# Patient Record
Sex: Male | Born: 1985 | Hispanic: No | Marital: Single | Smoking: Current every day smoker
Health system: Southern US, Community
[De-identification: ages and names within clinical notes are randomized; demographics above are authoritative.]

## PROBLEM LIST (undated history)

## (undated) DIAGNOSIS — N200 Calculus of kidney: Secondary | ICD-10-CM

## (undated) HISTORY — PX: TONGUE SURGERY: SHX810

---

## 2013-09-30 ENCOUNTER — Emergency Department (HOSPITAL_COMMUNITY)
Admission: EM | Admit: 2013-09-30 | Discharge: 2013-09-30 | Disposition: A | Discharge status: Home / Self Care | Payer: Self-pay | Attending: Emergency Medicine | Admitting: Emergency Medicine

## 2013-09-30 ENCOUNTER — Emergency Department (HOSPITAL_COMMUNITY): Payer: Self-pay

## 2013-09-30 ENCOUNTER — Encounter (HOSPITAL_COMMUNITY): Payer: Self-pay | Admitting: Emergency Medicine

## 2013-09-30 DIAGNOSIS — Z87442 Personal history of urinary calculi: Secondary | ICD-10-CM | POA: Insufficient documentation

## 2013-09-30 DIAGNOSIS — F17200 Nicotine dependence, unspecified, uncomplicated: Secondary | ICD-10-CM | POA: Insufficient documentation

## 2013-09-30 DIAGNOSIS — R109 Unspecified abdominal pain: Secondary | ICD-10-CM | POA: Insufficient documentation

## 2013-09-30 HISTORY — DX: Calculus of kidney: N20.0

## 2013-09-30 LAB — URINALYSIS, ROUTINE W REFLEX MICROSCOPIC
Bilirubin Urine: NEGATIVE
GLUCOSE, UA: NEGATIVE mg/dL
Hgb urine dipstick: NEGATIVE
Ketones, ur: NEGATIVE mg/dL
LEUKOCYTES UA: NEGATIVE
NITRITE: NEGATIVE
PROTEIN: NEGATIVE mg/dL
Specific Gravity, Urine: 1.015 (ref 1.005–1.030)
UROBILINOGEN UA: 0.2 mg/dL (ref 0.0–1.0)
pH: 7.5 (ref 5.0–8.0)

## 2013-09-30 MED ORDER — HYDROCODONE-ACETAMINOPHEN 7.5-325 MG PO TABS: 1.0000 | ORAL_TABLET | ORAL | Status: DC | PRN

## 2013-09-30 MED ORDER — ONDANSETRON HCL 4 MG/2ML IJ SOLN
4.0000 mg | Freq: Once | INTRAMUSCULAR | Status: DC
  Filled 2013-09-30: qty 2

## 2013-09-30 MED ORDER — ONDANSETRON HCL 4 MG/2ML IJ SOLN
4.0000 mg | Freq: Once | INTRAMUSCULAR | Status: AC
  Administered 2013-09-30: 4 mg via INTRAVENOUS

## 2013-09-30 MED ORDER — TAMSULOSIN HCL 0.4 MG PO CAPS: 0.4000 mg | ORAL_CAPSULE | Freq: Every day | ORAL | Status: DC

## 2013-09-30 MED ORDER — DICLOFENAC SODIUM 75 MG PO TBEC: 75.0000 mg | DELAYED_RELEASE_TABLET | Freq: Two times a day (BID) | ORAL | Status: DC

## 2013-09-30 MED ORDER — MORPHINE SULFATE 4 MG/ML IJ SOLN
8.0000 mg | Freq: Once | INTRAMUSCULAR | Status: AC
  Administered 2013-09-30: 8 mg via INTRAVENOUS
  Filled 2013-09-30: qty 2

## 2013-09-30 MED ORDER — TAMSULOSIN HCL 0.4 MG PO CAPS
0.4000 mg | ORAL_CAPSULE | Freq: Once | ORAL | Status: AC
  Administered 2013-09-30: 0.4 mg via ORAL
  Filled 2013-09-30: qty 1

## 2013-09-30 MED ORDER — KETOROLAC TROMETHAMINE 30 MG/ML IJ SOLN
30.0000 mg | Freq: Once | INTRAMUSCULAR | Status: AC
  Administered 2013-09-30: 30 mg via INTRAVENOUS
  Filled 2013-09-30: qty 1

## 2013-09-30 NOTE — Discharge Instructions (Signed)
The x-ray of your chest and abdomen is negative for any acute changes. There is a nonobstructing stone in the left kidney, nothing noted on the right. Your urine is negative for infection or evidence of blood. Please use the Flomax and diclofenac daily. May use Norco for pain if needed. Please see your primary physician as sone as possible. Please strain all urine over the next 3 or 4 days. Flank Pain Flank pain refers to pain that is located on the side of the body between the upper abdomen and the back. The pain may occur over a short period of time (acute) or may be long-term or reoccurring (chronic). It may be mild or severe. Flank pain can be caused by many things. CAUSES  Some of the more common causes of flank pain include:  Muscle strains.   Muscle spasms.   A disease of your spine (vertebral disk disease).   A lung infection (pneumonia).   Fluid around your lungs (pulmonary edema).   A kidney infection.   Kidney stones.   A very painful skin rash caused by the chickenpox virus (shingles).   Gallbladder disease.  HOME CARE INSTRUCTIONS  Home care will depend on the cause of your pain. In general,  Rest as directed by your caregiver.  Drink enough fluids to keep your urine clear or pale yellow.  Only take over-the-counter or prescription medicines as directed by your caregiver. Some medicines may help relieve the pain.  Tell your caregiver about any changes in your pain.  Follow up with your caregiver as directed. SEEK IMMEDIATE MEDICAL CARE IF:   Your pain is not controlled with medicine.   You have new or worsening symptoms.  Your pain increases.   You have abdominal pain.   You have shortness of breath.   You have persistent nausea or vomiting.   You have swelling in your abdomen.   You feel faint or pass out.   You have blood in your urine.  You have a fever or persistent symptoms for more than 2 3 days.  You have a fever and your  symptoms suddenly get worse. MAKE SURE YOU:   Understand these instructions.  Will watch your condition.  Will get help right away if you are not doing well or get worse. Document Released: 05/31/2005 Document Revised: 01/02/2012 Document Reviewed: 11/22/2011 [facility] Patient Information 2014 [place], [place].

## 2013-09-30 NOTE — ED Notes (Signed)
Patient was advised that he had to wait in Emergency department waiting area for 4 hours before walking home due to having pain medication. Patient responded with understanding

## 2013-09-30 NOTE — ED Notes (Signed)
Pt with right flank pain that started this morning, hx of kidney stones, pt denies blood in urine, +N/V

## 2013-09-30 NOTE — ED Provider Notes (Signed)
CSN: [id number]     Arrival date & time 09/30/13  1308 History   First MD Initiated Contact with Patient 09/30/13 1448     Chief Complaint  Patient presents with  . Flank Pain     (Consider location/radiation/quality/duration/timing/severity/associated sxs/prior Treatment) HPI Comments: Patient is a 28 year old male who presents to the emergency department with complaint of right flank area pain. The patient states that he noted pain this morning when he awakened it felt very much like a kidney stone pain that he had in the past. He denies passing any blood. He denies any fever or chills on yesterday or this morning. The patient states that he was able to pass his stone in the emergency department at the [facility] nearly a year ago after receiving Flomax. The patient has not had any injury to this area. He has not had any change in his appetite. He has not had nausea or vomiting. His been no testicular pain, an  no severe back pain.  Patient is a 28 y.o. male presenting with flank pain. The history is provided by the patient.  Flank Pain Pertinent negatives include no abdominal pain, arthralgias, chest pain, coughing or neck pain.    Past Medical History  Diagnosis Date  . Kidney stones    Past Surgical History  Procedure Laterality Date  . Tongue surgery     History reviewed. No pertinent family history. History  Substance Use Topics  . Smoking status: Current Every Day Smoker    Types: Cigarettes  . Smokeless tobacco: Not on file  . Alcohol Use: No    Review of Systems  Constitutional: Negative for activity change.       All ROS Neg except as noted in HPI  HENT: Negative for nosebleeds.   Eyes: Negative for photophobia and discharge.  Respiratory: Negative for cough, shortness of breath and wheezing.   Cardiovascular: Negative for chest pain and palpitations.  Gastrointestinal: Negative for abdominal pain and blood in stool.  Genitourinary: Positive for flank pain.  Negative for dysuria, frequency and hematuria.  Musculoskeletal: Negative for arthralgias, back pain and neck pain.  Skin: Negative.   Neurological: Negative for dizziness, seizures and speech difficulty.  Psychiatric/Behavioral: Negative for hallucinations and confusion.      Allergies  Review of patient's allergies indicates no known allergies.  Home Medications   Prior to Admission medications   Not on File   BP 136/75  Pulse 96  Temp(Src) 97.7 F (36.5 C) (Oral)  SpO2 96% Physical Exam  Nursing note and vitals reviewed. Constitutional: He is oriented to person, place, and time. He appears well-developed and well-nourished.  Non-toxic appearance.  HENT:  Head: Normocephalic.  Right Ear: Tympanic membrane and external ear normal.  Left Ear: Tympanic membrane and external ear normal.  Eyes: EOM and lids are normal. Pupils are equal, round, and reactive to light.  Neck: Normal range of motion. Neck supple. Carotid bruit is not present.  Cardiovascular: Normal rate, regular rhythm, normal heart sounds, intact distal pulses and normal pulses.   Pulmonary/Chest: Breath sounds normal. No respiratory distress.  Abdominal: Soft. Bowel sounds are normal. There is no hepatosplenomegaly. There is no tenderness. There is no rigidity, no guarding and no tenderness at McBurney's point.  Right flank area tenderness. No right lower abdomen pain. No significant pain at [facility] point. No pain with SLR.  No pain with heel drop.  Musculoskeletal: Normal range of motion.  Lymphadenopathy:       Head (right  side): No submandibular adenopathy present.       Head (left side): No submandibular adenopathy present.    He has no cervical adenopathy.  Neurological: He is alert and oriented to person, place, and time. He has normal strength. No cranial nerve deficit or sensory deficit.  Skin: Skin is warm and dry.  Psychiatric: He has a normal mood and affect. His speech is normal.    ED Course   Procedures (including critical care time) Labs Review Labs Reviewed  URINALYSIS, ROUTINE W REFLEX MICROSCOPIC    Imaging Review Dg Abd Acute W/chest  09/30/2013   CLINICAL DATA:  Flank pain and nausea  EXAM: ACUTE ABDOMEN SERIES (ABDOMEN 2 VIEW & CHEST 1 VIEW)  COMPARISON:  None.  FINDINGS: Cardiac shadow is within normal limits. The lungs are clear bilaterally.  Calcification is noted of the left mid kidney consistent with a nonobstructing stone. No definitive ureteral calculi are seen. No other focal abnormality is noted. No bony abnormality is seen.  IMPRESSION: Calcification of the left kidney consistent with a nonobstructing renal stone. No other focal abnormality is noted.   Electronically Signed   By: Alcide CleverMark  Lukens M.D.   On: 09/30/2013 15:42     EKG Interpretation None      MDM The acute abdomen film shows a calcification of the left kidney consistent with a nonobstructing renal stone, no evidence of calcifications on the right. The chest portion is within normal limits. The vital signs are well within normal limits. The pulse oximetry is 96% on room air. Within normal limits by my interpretation.  The patient received intravenous Toradol and morphine with significant improvement in his pain and discomfort.  Patient is advised to strain all urine. He is given a prescription for Flomax, and Toradol, and Norco.    Final diagnoses:  None    **I have reviewed nursing notes, vital signs, and all appropriate lab and imaging results for this patient.Kathie Dike*    Edwyna Dangerfield M Analucia Hush, PA-C 09/30/13 1624

## 2013-09-30 NOTE — ED Provider Notes (Signed)
Medical screening examination/treatment/procedure(s) were performed by non-physician practitioner and as supervising physician I was immediately available for consultation/collaboration.   EKG Interpretation None        Zeffie Bickert W Grabiel Schmutz, MD 09/30/13 2158 

## 2013-12-02 ENCOUNTER — Encounter (HOSPITAL_COMMUNITY): Payer: Self-pay | Admitting: Emergency Medicine

## 2013-12-02 ENCOUNTER — Emergency Department (HOSPITAL_COMMUNITY)
Admission: EM | Admit: 2013-12-02 | Discharge: 2013-12-02 | Disposition: A | Discharge status: Home / Self Care | Payer: Self-pay | Attending: Emergency Medicine | Admitting: Emergency Medicine

## 2013-12-02 DIAGNOSIS — Z792 Long term (current) use of antibiotics: Secondary | ICD-10-CM | POA: Insufficient documentation

## 2013-12-02 DIAGNOSIS — F17200 Nicotine dependence, unspecified, uncomplicated: Secondary | ICD-10-CM | POA: Insufficient documentation

## 2013-12-02 DIAGNOSIS — Z791 Long term (current) use of non-steroidal anti-inflammatories (NSAID): Secondary | ICD-10-CM | POA: Insufficient documentation

## 2013-12-02 DIAGNOSIS — K029 Dental caries, unspecified: Secondary | ICD-10-CM | POA: Insufficient documentation

## 2013-12-02 DIAGNOSIS — K0889 Other specified disorders of teeth and supporting structures: Secondary | ICD-10-CM

## 2013-12-02 DIAGNOSIS — K089 Disorder of teeth and supporting structures, unspecified: Secondary | ICD-10-CM | POA: Insufficient documentation

## 2013-12-02 DIAGNOSIS — Z87442 Personal history of urinary calculi: Secondary | ICD-10-CM | POA: Insufficient documentation

## 2013-12-02 MED ORDER — CLINDAMYCIN HCL 150 MG PO CAPS: 150.0000 mg | ORAL_CAPSULE | Freq: Four times a day (QID) | ORAL | Status: DC

## 2013-12-02 MED ORDER — CLINDAMYCIN HCL 150 MG PO CAPS
300.0000 mg | ORAL_CAPSULE | Freq: Once | ORAL | Status: AC
  Administered 2013-12-02: 300 mg via ORAL
  Filled 2013-12-02: qty 2

## 2013-12-02 MED ORDER — NAPROXEN 500 MG PO TABS: 500.0000 mg | ORAL_TABLET | Freq: Two times a day (BID) | ORAL | Status: DC

## 2013-12-02 NOTE — ED Notes (Signed)
Caleb Miller at bedside. 

## 2013-12-02 NOTE — ED Notes (Signed)
Upper right dental pain. Patient states that he is signed up for a dental clinic but it will be 8 months to get in to see them.

## 2013-12-02 NOTE — Discharge Instructions (Signed)

## 2013-12-03 ENCOUNTER — Emergency Department (HOSPITAL_COMMUNITY)
Admission: EM | Admit: 2013-12-03 | Discharge: 2013-12-03 | Disposition: A | Discharge status: Home / Self Care | Payer: Self-pay | Attending: Emergency Medicine | Admitting: Emergency Medicine

## 2013-12-03 ENCOUNTER — Encounter (HOSPITAL_COMMUNITY): Payer: Self-pay | Admitting: Emergency Medicine

## 2013-12-03 DIAGNOSIS — Z87442 Personal history of urinary calculi: Secondary | ICD-10-CM | POA: Insufficient documentation

## 2013-12-03 DIAGNOSIS — Z791 Long term (current) use of non-steroidal anti-inflammatories (NSAID): Secondary | ICD-10-CM | POA: Insufficient documentation

## 2013-12-03 DIAGNOSIS — Z792 Long term (current) use of antibiotics: Secondary | ICD-10-CM | POA: Insufficient documentation

## 2013-12-03 DIAGNOSIS — K089 Disorder of teeth and supporting structures, unspecified: Secondary | ICD-10-CM | POA: Insufficient documentation

## 2013-12-03 DIAGNOSIS — Z9889 Other specified postprocedural states: Secondary | ICD-10-CM | POA: Insufficient documentation

## 2013-12-03 DIAGNOSIS — F17200 Nicotine dependence, unspecified, uncomplicated: Secondary | ICD-10-CM | POA: Insufficient documentation

## 2013-12-03 DIAGNOSIS — K029 Dental caries, unspecified: Secondary | ICD-10-CM | POA: Insufficient documentation

## 2013-12-03 DIAGNOSIS — K0889 Other specified disorders of teeth and supporting structures: Secondary | ICD-10-CM

## 2013-12-03 MED ORDER — LIDOCAINE VISCOUS 2 % MT SOLN
OROMUCOSAL | Status: AC
  Administered 2013-12-03: 15 mL via OROMUCOSAL
  Filled 2013-12-03: qty 15

## 2013-12-03 MED ORDER — LIDOCAINE VISCOUS 2 % MT SOLN
15.0000 mL | Freq: Once | OROMUCOSAL | Status: AC
  Administered 2013-12-03: 15 mL via OROMUCOSAL

## 2013-12-03 NOTE — Discharge Instructions (Signed)
Dental Pain  Toothache is pain in or around a tooth. It may get worse with chewing or with cold or heat.   HOME CARE  · Your dentist may use a numbing medicine during treatment. If so, you may need to avoid eating until the medicine wears off. Ask your dentist about this.  · Only take medicine as told by your dentist or doctor.  · Avoid chewing food near the painful tooth until after all treatment is done. Ask your dentist about this.  GET HELP RIGHT AWAY IF:   · The problem gets worse or new problems appear.  · You have a fever.  · There is redness and puffiness (swelling) of the face, jaw, or neck.  · You cannot open your mouth.  · There is pain in the jaw.  · There is very bad pain that is not helped by medicine.  MAKE SURE YOU:   · Understand these instructions.  · Will watch your condition.  · Will get help right away if you are not doing well or get worse.  Document Released: 09/26/2007 Document Revised: 07/02/2011 Document Reviewed: 09/26/2007  [employer]® Patient Information ©2015 [employer]. This information is not intended to replace advice given to you by your health care provider. Make sure you discuss any questions you have with your health care provider.

## 2013-12-03 NOTE — ED Provider Notes (Signed)
CSN: [id number]     Arrival date & time 12/03/13  2013 History   First MD Initiated Contact with Patient 12/03/13 2059     Chief Complaint  Patient presents with  . Dental Pain   Caleb Miller is a 28 y.o. male who presents to the Emergency Department complaining of dental pain.  Patient was seen here last evening for same and returns due to continued pain and states the naprosyn he was prescribed is not helping his pain.   (Consider location/radiation/quality/duration/timing/severity/associated sxs/prior Treatment) Patient is a 28 y.o. male presenting with tooth pain. The history is provided by the patient.  Dental Pain Location:  Upper Upper teeth location:  7/RU lateral incisor Quality:  Throbbing Severity:  Severe Onset quality:  Gradual Timing:  Constant Progression:  Worsening Chronicity:  New Context: dental caries and poor dentition   Context: not abscess   Relieved by:  Nothing Worsened by:  Hot food/drink and cold food/drink Ineffective treatments:  NSAIDs Associated symptoms: no congestion, no difficulty swallowing, no drooling, no facial pain, no facial swelling, no fever, no gum swelling, no headaches, no neck pain, no neck swelling, no oral bleeding and no trismus   Risk factors: lack of dental care, periodontal disease and smoking   Risk factors: no diabetes     Past Medical History  Diagnosis Date  . Kidney stones    Past Surgical History  Procedure Laterality Date  . Tongue surgery     No family history on file. History  Substance Use Topics  . Smoking status: Current Every Day Smoker    Types: Cigarettes  . Smokeless tobacco: Not on file  . Alcohol Use: No    Review of Systems  Constitutional: Negative for fever and appetite change.  HENT: Positive for dental problem. Negative for congestion, drooling, facial swelling, sore throat and trouble swallowing.   Eyes: Negative for pain and visual disturbance.  Musculoskeletal: Negative for neck pain  and neck stiffness.  Neurological: Negative for dizziness, facial asymmetry and headaches.  Hematological: Negative for adenopathy.  All other systems reviewed and are negative.     Allergies  Review of patient's allergies indicates no known allergies.  Home Medications   Prior to Admission medications   Medication Sig Start Date End Date Taking? Authorizing Provider  clindamycin (CLEOCIN) 150 MG capsule Take 1 capsule (150 mg total) by mouth 4 (four) times daily. For 7 days 12/02/13  Yes Athenia Rys L. Pretty Weltman, PA-C  naproxen (NAPROSYN) 500 MG tablet Take 1 tablet (500 mg total) by mouth 2 (two) times daily. 12/02/13  Yes Verdell Dykman L. Shantele Reller, PA-C   BP 124/65  Pulse 84  Temp(Src) 97.7 F (36.5 C) (Oral)  Resp 16  Ht 5\' 10"  (1.778 m)  Wt 180 lb (81.647 kg)  BMI 25.83 kg/m2  SpO2 100% Physical Exam  Nursing note and vitals reviewed. Constitutional: He is oriented to person, place, and time. He appears well-developed and well-nourished. No distress.  HENT:  Head: Normocephalic and atraumatic.  Right Ear: Tympanic membrane and ear canal normal.  Left Ear: Tympanic membrane and ear canal normal.  Mouth/Throat: Uvula is midline, oropharynx is clear and moist and mucous membranes are normal. No trismus in the jaw. Dental caries present. No dental abscesses or uvula swelling.  Pt has widespread dental decay and ttp of the right upper lateral incisor.  Mild erythema of the surrounding gums.    No facial swelling, obvious dental abscess, trismus, or sublingual abnml.    Neck: Normal range  of motion. Neck supple.  Cardiovascular: Normal rate, regular rhythm and normal heart sounds.   No murmur heard. Pulmonary/Chest: Effort normal and breath sounds normal. No respiratory distress.  Musculoskeletal: Normal range of motion.  Lymphadenopathy:    He has no cervical adenopathy.  Neurological: He is alert and oriented to person, place, and time. He exhibits normal muscle tone. Coordination normal.   Skin: Skin is warm and dry.    ED Course  Procedures (including critical care time) Labs Review Labs Reviewed - No data to display  Imaging Review No results found.   EKG Interpretation None      MDM   Final diagnoses:  Pain, dental    Pt is well appearing.  Airway patent.  No concerning symptoms for infection to the floor of the mouth or deep structures of the neck.  Patient also seen by me on previous visit.  Given info for an upcoming dental fair in [place].  Dispensed 15 ml cup viscous lidocaine for topical application to the tooth and given strict use precautions not to swallow.  He agrees to continue the clinda and naprosyn as prescribed.      Giovonni Poirier L. Trisha Mangle, PA-C 12/03/13 2135

## 2013-12-03 NOTE — ED Notes (Signed)
Discharge instructions reviewed with pt, questions answered. Pt verbalized understanding.  

## 2013-12-03 NOTE — ED Provider Notes (Signed)
Medical screening examination/treatment/procedure(s) were performed by non-physician practitioner and as supervising physician I was immediately available for consultation/collaboration.   EKG Interpretation None        Attikus Bartoszek L Vaibhav Fogleman, MD 12/03/13 2218 

## 2013-12-03 NOTE — ED Notes (Signed)
Patient states was seen yesterday for dental pain; states has been taking medication, but still having pain.

## 2013-12-04 NOTE — ED Provider Notes (Signed)
CSN: [id number]     Arrival date & time 12/02/13  1921 History   First MD Initiated Contact with Patient 12/02/13 1935     Chief Complaint  Patient presents with  . Dental Pain     (Consider location/radiation/quality/duration/timing/severity/associated sxs/prior Treatment)  Judithann Gravesnthony Frey is a 28 y.o. male who presents to the Emergency Department complaining of dental pain.  Patient states he has tried to make an appt with a dental clinic in [place], where he lives, but cannot get an appt for another 8 months and comes here with significant other who is also here for evaluation.   Patient is a 28 y.o. male presenting with tooth pain. The history is provided by the patient.  Dental Pain Location:  Upper Upper teeth location:  7/RU lateral incisor Quality:  Localized, throbbing and sharp Onset quality:  Gradual Duration: several days.   Timing:  Constant Progression:  Worsening Chronicity:  New Context: dental caries and poor dentition   Context: not trauma   Relieved by:  Nothing Worsened by:  Cold food/drink Ineffective treatments:  NSAIDs Associated symptoms: no congestion, no drooling, no facial pain, no facial swelling, no fever, no gum swelling, no headaches, no neck pain, no neck swelling, no oral bleeding, no oral lesions and no trismus   Risk factors: lack of dental care, periodontal disease and smoking   Risk factors: no diabetes     Past Medical History  Diagnosis Date  . Kidney stones    Past Surgical History  Procedure Laterality Date  . Tongue surgery     History reviewed. No pertinent family history. History  Substance Use Topics  . Smoking status: Current Every Day Smoker    Types: Cigarettes  . Smokeless tobacco: Not on file  . Alcohol Use: No    Review of Systems  Constitutional: Negative for fever and appetite change.  HENT: Positive for dental problem. Negative for congestion, drooling, facial swelling, mouth sores, sore throat and trouble  swallowing.   Eyes: Negative for pain and visual disturbance.  Musculoskeletal: Negative for neck pain and neck stiffness.  Neurological: Negative for dizziness, facial asymmetry and headaches.  Hematological: Negative for adenopathy.  All other systems reviewed and are negative.     Allergies  Review of patient's allergies indicates no known allergies.  Home Medications   Prior to Admission medications   Medication Sig Start Date End Date Taking? Authorizing Provider  clindamycin (CLEOCIN) 150 MG capsule Take 1 capsule (150 mg total) by mouth 4 (four) times daily. For 7 days 12/02/13   Eulla Kochanowski L. Yahira Timberman, PA-C  naproxen (NAPROSYN) 500 MG tablet Take 1 tablet (500 mg total) by mouth 2 (two) times daily. 12/02/13   Aeriana Speece L. Jibril Mcminn, PA-C   BP 121/79  Pulse 69  Temp(Src) 98.6 F (37 C) (Oral)  Resp 16  Ht 5\' 10"  (1.778 m)  Wt 170 lb (77.111 kg)  BMI 24.39 kg/m2  SpO2 100% Physical Exam  Nursing note and vitals reviewed. Constitutional: He is oriented to person, place, and time. He appears well-developed and well-nourished. No distress.  HENT:  Head: Normocephalic and atraumatic.  Right Ear: Tympanic membrane and ear canal normal.  Left Ear: Tympanic membrane and ear canal normal.  Mouth/Throat: Uvula is midline, oropharynx is clear and moist and mucous membranes are normal. No trismus in the jaw. Dental caries present. No dental abscesses or uvula swelling.  Pt has widespread dental decay and ttp of the right upper lateral incisor.  Mild erythema of the  surrounding gums.    No facial swelling, obvious dental abscess, trismus, or sublingual abnml.      Eyes: Conjunctivae and EOM are normal. Pupils are equal, round, and reactive to light.  Neck: Normal range of motion. Neck supple.  Cardiovascular: Normal rate, regular rhythm and normal heart sounds.   No murmur heard. Pulmonary/Chest: Effort normal and breath sounds normal.  Musculoskeletal: Normal range of motion.   Lymphadenopathy:    He has no cervical adenopathy.  Neurological: He is alert and oriented to person, place, and time. He exhibits normal muscle tone. Coordination normal.  Skin: Skin is warm and dry.    ED Course  Procedures (including critical care time) Labs Review Labs Reviewed - No data to display  Imaging Review No results found.   EKG Interpretation None      MDM   Final diagnoses:  Pain, dental    Patient reviewed on the [place] narcotic database.  Most recent prescription filed was June.    Pt is well appearing.  VSS.  No concerning sx's for infection to the floor of the mouth or deep structures of the neck.  He agrees to close f/u with a dentist, referral info given for local dentists.  Rx written for naprosyn and clindamycin.       Naheim Burgen L. Tayron Hunnell, PA-C 12/04/13 0101

## 2013-12-06 NOTE — ED Provider Notes (Signed)
Medical screening examination/treatment/procedure(s) were performed by non-physician practitioner and as supervising physician I was immediately available for consultation/collaboration.   EKG Interpretation None      Tyrek Lawhorn, MD, FACEP   Abdi Husak L Annelise Mccoy, MD 12/06/13 0705 

## 2014-02-19 ENCOUNTER — Emergency Department (HOSPITAL_COMMUNITY): Payer: Self-pay

## 2014-02-19 ENCOUNTER — Emergency Department (HOSPITAL_COMMUNITY)
Admission: EM | Admit: 2014-02-19 | Discharge: 2014-02-19 | Disposition: A | Discharge status: Home / Self Care | Payer: Self-pay | Attending: Emergency Medicine | Admitting: Emergency Medicine

## 2014-02-19 ENCOUNTER — Encounter (HOSPITAL_COMMUNITY): Payer: Self-pay | Admitting: Emergency Medicine

## 2014-02-19 DIAGNOSIS — Z720 Tobacco use: Secondary | ICD-10-CM | POA: Insufficient documentation

## 2014-02-19 DIAGNOSIS — N200 Calculus of kidney: Secondary | ICD-10-CM | POA: Insufficient documentation

## 2014-02-19 DIAGNOSIS — R109 Unspecified abdominal pain: Secondary | ICD-10-CM

## 2014-02-19 LAB — CBC WITH DIFFERENTIAL/PLATELET
BASOS PCT: 1 % (ref 0–1)
Basophils Absolute: 0 10*3/uL (ref 0.0–0.1)
Eosinophils Absolute: 0.3 10*3/uL (ref 0.0–0.7)
Eosinophils Relative: 4 % (ref 0–5)
HCT: 36.9 % — ABNORMAL LOW (ref 39.0–52.0)
HEMOGLOBIN: 13 g/dL (ref 13.0–17.0)
Lymphocytes Relative: 39 % (ref 12–46)
Lymphs Abs: 2.7 10*3/uL (ref 0.7–4.0)
MCH: 31 pg (ref 26.0–34.0)
MCHC: 35.2 g/dL (ref 30.0–36.0)
MCV: 88.1 fL (ref 78.0–100.0)
MONO ABS: 0.5 10*3/uL (ref 0.1–1.0)
MONOS PCT: 7 % (ref 3–12)
NEUTROS PCT: 49 % (ref 43–77)
Neutro Abs: 3.4 10*3/uL (ref 1.7–7.7)
Platelets: 237 10*3/uL (ref 150–400)
RBC: 4.19 MIL/uL — ABNORMAL LOW (ref 4.22–5.81)
RDW: 12.7 % (ref 11.5–15.5)
WBC: 6.9 10*3/uL (ref 4.0–10.5)

## 2014-02-19 LAB — BASIC METABOLIC PANEL
Anion gap: 13 (ref 5–15)
BUN: 11 mg/dL (ref 6–23)
CO2: 26 mEq/L (ref 19–32)
CREATININE: 1 mg/dL (ref 0.50–1.35)
Calcium: 9.6 mg/dL (ref 8.4–10.5)
Chloride: 103 mEq/L (ref 96–112)
Glucose, Bld: 93 mg/dL (ref 70–99)
POTASSIUM: 4 meq/L (ref 3.7–5.3)
Sodium: 142 mEq/L (ref 137–147)

## 2014-02-19 LAB — URINALYSIS, ROUTINE W REFLEX MICROSCOPIC
BILIRUBIN URINE: NEGATIVE
Glucose, UA: NEGATIVE mg/dL
Hgb urine dipstick: NEGATIVE
KETONES UR: NEGATIVE mg/dL
Leukocytes, UA: NEGATIVE
NITRITE: NEGATIVE
PH: 6.5 (ref 5.0–8.0)
Protein, ur: NEGATIVE mg/dL
Specific Gravity, Urine: 1.019 (ref 1.005–1.030)
Urobilinogen, UA: 1 mg/dL (ref 0.0–1.0)

## 2014-02-19 MED ORDER — OXYCODONE-ACETAMINOPHEN 5-325 MG PO TABS: 1.0000 | ORAL_TABLET | Freq: Four times a day (QID) | ORAL | Status: DC | PRN

## 2014-02-19 MED ORDER — SODIUM CHLORIDE 0.9 % IV BOLUS (SEPSIS)
1000.0000 mL | Freq: Once | INTRAVENOUS | Status: AC
  Administered 2014-02-19: 1000 mL via INTRAVENOUS

## 2014-02-19 MED ORDER — HYDROMORPHONE HCL 1 MG/ML IJ SOLN
1.0000 mg | Freq: Once | INTRAMUSCULAR | Status: AC
  Administered 2014-02-19: 1 mg via INTRAVENOUS
  Filled 2014-02-19: qty 1

## 2014-02-19 MED ORDER — ONDANSETRON HCL 4 MG/2ML IJ SOLN
4.0000 mg | Freq: Once | INTRAMUSCULAR | Status: AC
  Administered 2014-02-19: 4 mg via INTRAVENOUS
  Filled 2014-02-19: qty 2

## 2014-02-19 NOTE — ED Notes (Addendum)
Pt restless/writhing, polite, cooperative, NAD, interactive, resps panting, skin W&D, speaking in clear phrases, [facility]. Pt reports: "Diagnosed with 7mm stone on L side, dx'd in [place] this am, traveling back [place] to [place], [place], father has set up a GU appt in [place] with (unknown GU MD), d/c paperwork is in his wife's car," orders received and initiated. [facility] Pt aware of need for urine sample.

## 2014-02-19 NOTE — ED Provider Notes (Addendum)
  Physical Exam  BP 123/58  Pulse 82  Temp(Src) 97.3 F (36.3 C) (Oral)  Resp 22  SpO2 95%  Physical Exam  ED Course  Procedures  MDM Assumed care of pt from Dr. Wilkie AyeHorton in stable condition awaiting renal US for nephrolithiasis.  This returned with mild fullness of the L renal calyx concerning for possible early hydro, but no frank hydronephrosis or stones visualized.  He was previously diagnosed with nephrolithiasis and has urology fu.  Labs today unremarkable.  Will have him fu.  DC home with standard return precautions.        Mirian MoMatthew Gentry, MD 02/19/14 [id number]  Spoke with pt who says he was prescribed 10 percocet 2 days ago and has been taking them as prescribed.  I cannot verify this prescription at this time, however the pt has received 20 percocet on 10/12.  I discussed with the patient that I would prescribe him medication, however he would not receive any more and he has fu with urology on Monday.  I also discussed his concerning issues, namely that he has had an essentially negative wu today, however with L sided renal findings, I feel that the pt has nephrolithiasis and is symptomatic, so I am comfortable for this 1 occasion providing more.    Mirian MoMatthew Gentry, MD 02/19/14 [phone]

## 2014-02-19 NOTE — ED Notes (Signed)
Pt was recently diagnosed with kidney stones at hospital in [place]. Pt reports he was given phenergan, percocet and flomax for sx, was on his way home to [place] when became nauseous and pain was unbearable even after pain medication. Pt reports pain 10/10, unable to sit still. Pt states medication he has is not working for him. Pt has hx of Kidney stones.

## 2014-02-19 NOTE — ED Notes (Signed)
Pt states he "has had blood in urine microscopically-- last hospital only did an Ultrasound -- and told me they couldn't see stones on an ultrasounds" . Also states "toradol doesn't work for me-- you know its bad when narcs don't work-- the [employer] 5/325 haven't done anything for me"

## 2014-02-19 NOTE — ED Notes (Signed)
Pt to imaging

## 2014-02-19 NOTE — ED Provider Notes (Signed)
CSN: [id number]     Arrival date & time 02/19/14  0421 History   First MD Initiated Contact with Patient 02/19/14 [phone]     Chief Complaint  Patient presents with  . Flank Pain     (Consider location/radiation/quality/duration/timing/severity/associated sxs/prior Treatment) HPI  This is a 28 year old male who presents with left flank pain.  Patient reports that she had onset of pain earlier today. He was seen at a hospital in [place] and diagnosed with a 7 mm kidney stone. He was discharged with Phenergan, Percocet, and Flomax. He reports worsening of his symptoms. States he he has follow-up with urology on Monday in [place]. [place] in [place] earlier today. Patient reports associated nausea and no fevers. Pain is characterized as sharp, "15 out of 10", and nonradiating.  Past Medical History  Diagnosis Date  . Kidney stones    Past Surgical History  Procedure Laterality Date  . Tongue surgery     No family history on file. History  Substance Use Topics  . Smoking status: Current Every Day Smoker    Types: Cigarettes  . Smokeless tobacco: Not on file  . Alcohol Use: No    Review of Systems  Constitutional: Negative.  Negative for fever.  Respiratory: Negative.  Negative for chest tightness and shortness of breath.   Cardiovascular: Negative.  Negative for chest pain.  Gastrointestinal: Positive for nausea, vomiting and abdominal pain. Negative for diarrhea.  Genitourinary: Positive for flank pain. Negative for dysuria and hematuria.  Neurological: Negative for headaches.  All other systems reviewed and are negative.     Allergies  Review of patient's allergies indicates no known allergies.  Home Medications   Prior to Admission medications   Not on File   BP 122/70  Pulse 85  Temp(Src) 97.3 F (36.3 C) (Oral)  Resp 17  SpO2 96% Physical Exam  Nursing note and vitals reviewed. Constitutional: He is oriented to person, place, and time.   Uncomfortable appearing  HENT:  Head: Normocephalic and atraumatic.  Cardiovascular: Normal rate, regular rhythm and normal heart sounds.   No murmur heard. Pulmonary/Chest: Effort normal and breath sounds normal. No respiratory distress. He has no wheezes.  Abdominal: Soft. Bowel sounds are normal. There is no tenderness.  Genitourinary:  No CVA tenderness  Musculoskeletal: He exhibits no edema.  Neurological: He is alert and oriented to person, place, and time.  Skin: Skin is warm and dry.  Multiple tattoos  Psychiatric: He has a normal mood and affect.    ED Course  Procedures (including critical care time) Labs Review Labs Reviewed  CBC WITH DIFFERENTIAL - Abnormal; Notable for the following:    RBC 4.19 (*)    HCT 36.9 (*)    All other components within normal limits  URINALYSIS, ROUTINE W REFLEX MICROSCOPIC  BASIC METABOLIC PANEL    Imaging Review No results found.                     DG Abd 1 View (Final result) Result time: 02/19/14 06:47:45   Final result by Rad Results In Interface (02/19/14 06:47:45)   Narrative:   CLINICAL DATA: RIGHT flank pain beginning yesterday, history of kidney stones.  EXAM: ABDOMEN - 1 VIEW  COMPARISON: Renal ultrasound February 19, 2014 and abdominal radiograph September 30, 2013  FINDINGS: The bowel gas pattern is normal. No radio-opaque calculi or other significant radiographic abnormality are seen.  IMPRESSION: Negative.   Electronically Signed By: Awilda Metroourtnay Bloomer On:  02/19/2014 06:47       EKG Interpretation None      MDM   Final diagnoses:  Flank pain    Patient presenst with flank pain.  Reports being diagnosed with 7mm kidney stone earlier today.  Uncomfortable but nontoxic.  Patient given pain medication.  Labs reassuring.  KUB without radioopaque stone.  Renal US pending.    Signed out to Dr. Littie DeedsGentry.    Shon Batonourtney F Horton, MD 02/21/14 [phone]

## 2014-02-19 NOTE — Discharge Instructions (Signed)

## 2014-02-24 ENCOUNTER — Encounter (HOSPITAL_COMMUNITY): Payer: Self-pay | Admitting: Emergency Medicine

## 2014-02-24 ENCOUNTER — Emergency Department (HOSPITAL_COMMUNITY): Payer: Self-pay

## 2014-02-24 ENCOUNTER — Emergency Department (HOSPITAL_COMMUNITY)
Admission: EM | Admit: 2014-02-24 | Discharge: 2014-02-24 | Disposition: A | Discharge status: Home / Self Care | Payer: Self-pay | Attending: Emergency Medicine | Admitting: Emergency Medicine

## 2014-02-24 DIAGNOSIS — N200 Calculus of kidney: Secondary | ICD-10-CM | POA: Insufficient documentation

## 2014-02-24 DIAGNOSIS — R52 Pain, unspecified: Secondary | ICD-10-CM

## 2014-02-24 DIAGNOSIS — Z720 Tobacco use: Secondary | ICD-10-CM | POA: Insufficient documentation

## 2014-02-24 LAB — URINALYSIS, ROUTINE W REFLEX MICROSCOPIC
Glucose, UA: NEGATIVE mg/dL
Ketones, ur: NEGATIVE mg/dL
Leukocytes, UA: NEGATIVE
NITRITE: NEGATIVE
PH: 5.5 (ref 5.0–8.0)
Specific Gravity, Urine: >1.03 — ABNORMAL HIGH (ref 1.005–1.030)
Urobilinogen, UA: 0.2 mg/dL (ref 0.0–1.0)

## 2014-02-24 LAB — CBC WITH DIFFERENTIAL/PLATELET
BASOS PCT: 0 % (ref 0–1)
Basophils Absolute: 0 10*3/uL (ref 0.0–0.1)
EOS ABS: 0.2 10*3/uL (ref 0.0–0.7)
Eosinophils Relative: 2 % (ref 0–5)
HCT: 40.4 % (ref 39.0–52.0)
Hemoglobin: 14.1 g/dL (ref 13.0–17.0)
Lymphocytes Relative: 33 % (ref 12–46)
Lymphs Abs: 2.4 10*3/uL (ref 0.7–4.0)
MCH: 31.6 pg (ref 26.0–34.0)
MCHC: 34.9 g/dL (ref 30.0–36.0)
MCV: 90.6 fL (ref 78.0–100.0)
Monocytes Absolute: 0.6 10*3/uL (ref 0.1–1.0)
Monocytes Relative: 8 % (ref 3–12)
NEUTROS PCT: 57 % (ref 43–77)
Neutro Abs: 4.2 10*3/uL (ref 1.7–7.7)
PLATELETS: 223 10*3/uL (ref 150–400)
RBC: 4.46 MIL/uL (ref 4.22–5.81)
RDW: 12.9 % (ref 11.5–15.5)
WBC: 7.4 10*3/uL (ref 4.0–10.5)

## 2014-02-24 LAB — COMPREHENSIVE METABOLIC PANEL
ALK PHOS: 72 U/L (ref 39–117)
ALT: 32 U/L (ref 0–53)
AST: 21 U/L (ref 0–37)
Albumin: 4.5 g/dL (ref 3.5–5.2)
Anion gap: 11 (ref 5–15)
BUN: 21 mg/dL (ref 6–23)
CO2: 30 mEq/L (ref 19–32)
Calcium: 9.8 mg/dL (ref 8.4–10.5)
Chloride: 100 mEq/L (ref 96–112)
Creatinine, Ser: 1.1 mg/dL (ref 0.50–1.35)
GFR calc Af Amer: >90 mL/min (ref >90)
GFR calc non Af Amer: >90 mL/min (ref >90)
Glucose, Bld: 132 mg/dL — ABNORMAL HIGH (ref 70–99)
POTASSIUM: 3.6 meq/L — AB (ref 3.7–5.3)
SODIUM: 141 meq/L (ref 137–147)
TOTAL PROTEIN: 7.8 g/dL (ref 6.0–8.3)
Total Bilirubin: 0.7 mg/dL (ref 0.3–1.2)

## 2014-02-24 LAB — URINE MICROSCOPIC-ADD ON

## 2014-02-24 MED ORDER — ONDANSETRON HCL 4 MG/2ML IJ SOLN
4.0000 mg | Freq: Once | INTRAMUSCULAR | Status: AC
  Administered 2014-02-24: 4 mg via INTRAVENOUS
  Filled 2014-02-24: qty 2

## 2014-02-24 MED ORDER — MORPHINE SULFATE 4 MG/ML IJ SOLN
6.0000 mg | Freq: Once | INTRAMUSCULAR | Status: AC
  Administered 2014-02-24: 6 mg via INTRAVENOUS
  Filled 2014-02-24: qty 2

## 2014-02-24 MED ORDER — OXYCODONE-ACETAMINOPHEN 5-325 MG PO TABS: 1.0000 | ORAL_TABLET | Freq: Four times a day (QID) | ORAL | Status: DC | PRN

## 2014-02-24 MED ORDER — HYDROMORPHONE HCL 1 MG/ML IJ SOLN
1.0000 mg | Freq: Once | INTRAMUSCULAR | Status: AC
  Administered 2014-02-24: 1 mg via INTRAVENOUS
  Filled 2014-02-24: qty 1

## 2014-02-24 MED ORDER — KETOROLAC TROMETHAMINE 30 MG/ML IJ SOLN
30.0000 mg | Freq: Once | INTRAMUSCULAR | Status: AC
  Administered 2014-02-24: 30 mg via INTRAVENOUS
  Filled 2014-02-24: qty 1

## 2014-02-24 MED ORDER — PROMETHAZINE HCL 25 MG PO TABS: 25.0000 mg | ORAL_TABLET | Freq: Four times a day (QID) | ORAL | Status: DC | PRN

## 2014-02-24 NOTE — ED Notes (Signed)
Pt reports left flank pain, hematuria,dysuria,intermittent vomiting since last night. nad noted.

## 2014-02-24 NOTE — Discharge Instructions (Signed)
Follow up Tuesday with your urologist as planned

## 2014-02-24 NOTE — ED Provider Notes (Signed)
CSN: [id number]     Arrival date & time 02/24/14  1602 History   First MD Initiated Contact with Patient 02/24/14 1621     Chief Complaint  Patient presents with  . Flank Pain     (Consider location/radiation/quality/duration/timing/severity/associated sxs/prior Treatment) Patient is a 28 y.o. male presenting with flank pain. The history is provided by the patient (  pt complains of left flank pain).  Flank Pain This is a new problem. The current episode started more than 2 days ago. The problem occurs constantly. The problem has not changed since onset.Associated symptoms include abdominal pain. Pertinent negatives include no chest pain and no headaches. Nothing aggravates the symptoms. Nothing relieves the symptoms.    Past Medical History  Diagnosis Date  . Kidney stones    Past Surgical History  Procedure Laterality Date  . Tongue surgery     History reviewed. No pertinent family history. History  Substance Use Topics  . Smoking status: Current Every Day Smoker -- 0.50 packs/day    Types: Cigarettes  . Smokeless tobacco: Not on file  . Alcohol Use: No    Review of Systems  Constitutional: Negative for appetite change and fatigue.  HENT: Negative for congestion, ear discharge and sinus pressure.   Eyes: Negative for discharge.  Respiratory: Negative for cough.   Cardiovascular: Negative for chest pain.  Gastrointestinal: Positive for abdominal pain. Negative for diarrhea.  Genitourinary: Positive for flank pain. Negative for frequency and hematuria.  Musculoskeletal: Negative for back pain.  Skin: Negative for rash.  Neurological: Negative for seizures and headaches.  Psychiatric/Behavioral: Negative for hallucinations.      Allergies  Review of patient's allergies indicates no known allergies.  Home Medications   Prior to Admission medications   Medication Sig Start Date End Date Taking? Authorizing Provider  oxyCODONE-acetaminophen (PERCOCET/ROXICET) 5-325  MG per tablet Take 1 tablet by mouth every 6 (six) hours as needed. 02/24/14   Benny LennertJoseph L Tee Richeson, MD  promethazine (PHENERGAN) 25 MG tablet Take 1 tablet (25 mg total) by mouth every 6 (six) hours as needed for nausea or vomiting. 02/24/14   Benny LennertJoseph L Jsaon Yoo, MD   BP 109/61 mmHg  Pulse 89  Temp(Src) 97.9 F (36.6 C) (Oral)  Resp 19  Ht 5\' 10"  (1.778 m)  Wt 180 lb (81.647 kg)  BMI 25.83 kg/m2  SpO2 99% Physical Exam  Constitutional: He is oriented to person, place, and time. He appears well-developed.  HENT:  Head: Normocephalic.  Eyes: Conjunctivae and EOM are normal. No scleral icterus.  Neck: Neck supple. No thyromegaly present.  Cardiovascular: Normal rate and regular rhythm.  Exam reveals no gallop and no friction rub.   No murmur heard. Pulmonary/Chest: No stridor. He has no wheezes. He has no rales. He exhibits no tenderness.  Abdominal: He exhibits no distension. There is no tenderness. There is no rebound.  Genitourinary:  Tender left flank  Musculoskeletal: Normal range of motion. He exhibits no edema.  Lymphadenopathy:    He has no cervical adenopathy.  Neurological: He is oriented to person, place, and time. He exhibits normal muscle tone. Coordination normal.  Skin: No rash noted. No erythema.  Psychiatric: He has a normal mood and affect. His behavior is normal.    ED Course  Procedures (including critical care time) Labs Review Labs Reviewed  COMPREHENSIVE METABOLIC PANEL - Abnormal; Notable for the following:    Potassium 3.6 (*)    Glucose, Bld 132 (*)    All other components within normal  limits  URINALYSIS, ROUTINE W REFLEX MICROSCOPIC - Abnormal; Notable for the following:    Color, Urine AMBER (*)    Specific Gravity, Urine >1.030 (*)    Hgb urine dipstick LARGE (*)    Bilirubin Urine SMALL (*)    Protein, ur TRACE (*)    All other components within normal limits  URINE MICROSCOPIC-ADD ON - Abnormal; Notable for the following:    Squamous Epithelial /  LPF FEW (*)    Bacteria, UA FEW (*)    Crystals CA OXALATE CRYSTALS (*)    All other components within normal limits  CBC WITH DIFFERENTIAL  URINALYSIS, ROUTINE W REFLEX MICROSCOPIC    Imaging Review Ct Abdomen Pelvis Wo Contrast  02/24/2014   CLINICAL DATA:  Left lower quadrant and flank pain  EXAM: CT ABDOMEN AND PELVIS WITHOUT CONTRAST  TECHNIQUE: Multidetector CT imaging of the abdomen and pelvis was performed following the standard protocol without oral or intravenous contrast material administration.  COMPARISON:  February 23, 2014  FINDINGS: Lung bases are clear.  Liver is mildly prominent measuring 17.2 cm in length. No focal liver lesions are identified on this noncontrast enhanced study. Gallbladder wall is not thickened. There is no biliary duct dilatation.  Spleen, pancreas, and adrenals appear normal.  There is a 1 mm nonobstructing calculus in the upper pole of the right kidney. There is no hydronephrosis or mass the right kidney. There is no demonstrable renal calculus on the right.  On the left, there is no mass or intrarenal calculus. There is mild hydronephrosis. There is a calculus in the left ureter measuring 5 mm at the level of L4-5, minimally more distant compared to study 1 day prior. No new ureteral calculus is seen.  In the pelvis, the urinary bladder is midline with normal wall thickness. There is no pelvic mass or fluid. Appendix appears normal.  There is no bowel obstruction. No free air or portal venous air. There is no ascites, adenopathy, or abscess in the abdomen or pelvis. There is no demonstrable abdominal aortic aneurysm. There are no blastic or lytic bone lesions.  IMPRESSION: 5 mm calculus again noted in the left ureter, minimally progressed distally compared to 1 day prior. Mild hydronephrosis remains on the left.  1 mm nonobstructing calculus upper pole right kidney. No right-sided hydronephrosis.  No bowel obstruction.  No abscess.  Appendix appears normal.  Liver  mildly prominent but otherwise unremarkable on this noncontrast enhanced study.   Electronically Signed   By: Bretta BangWilliam  Woodruff M.D.   On: 02/24/2014 17:21     EKG Interpretation None      MDM   Final diagnoses:  Pain  Kidney stone    Kidney stone,  Pt to follow up with urology next week.  The chart was scribed for me under my direct supervision.  I personally performed the history, physical, and medical decision making and all procedures in the evaluation of this patient.Benny Lennert.     Juron Vorhees L Nichole Neyer, MD 02/24/14 [phone]

## 2014-02-28 ENCOUNTER — Encounter (HOSPITAL_COMMUNITY): Payer: Self-pay | Admitting: Oncology

## 2014-02-28 ENCOUNTER — Emergency Department (HOSPITAL_COMMUNITY)
Admission: EM | Admit: 2014-02-28 | Discharge: 2014-02-28 | Disposition: A | Discharge status: Home / Self Care | Payer: Self-pay | Attending: Emergency Medicine | Admitting: Emergency Medicine

## 2014-02-28 DIAGNOSIS — N23 Unspecified renal colic: Secondary | ICD-10-CM | POA: Insufficient documentation

## 2014-02-28 DIAGNOSIS — Z720 Tobacco use: Secondary | ICD-10-CM | POA: Insufficient documentation

## 2014-02-28 DIAGNOSIS — Z87442 Personal history of urinary calculi: Secondary | ICD-10-CM | POA: Insufficient documentation

## 2014-02-28 MED ORDER — SODIUM CHLORIDE 0.9 % IV SOLN
Freq: Once | INTRAVENOUS | Status: AC
  Administered 2014-02-28: 03:00:00 via INTRAVENOUS

## 2014-02-28 MED ORDER — HYDROMORPHONE HCL 1 MG/ML IJ SOLN
1.0000 mg | Freq: Once | INTRAMUSCULAR | Status: AC
  Administered 2014-02-28: 1 mg via INTRAVENOUS
  Filled 2014-02-28: qty 1

## 2014-02-28 MED ORDER — OXYCODONE-ACETAMINOPHEN 5-325 MG PO TABS: 1.0000 | ORAL_TABLET | Freq: Four times a day (QID) | ORAL | Status: AC | PRN

## 2014-02-28 MED ORDER — KETOROLAC TROMETHAMINE 30 MG/ML IJ SOLN
30.0000 mg | Freq: Once | INTRAMUSCULAR | Status: AC
  Administered 2014-02-28: 30 mg via INTRAVENOUS
  Filled 2014-02-28: qty 1

## 2014-02-28 MED ORDER — ONDANSETRON HCL 4 MG/2ML IJ SOLN
4.0000 mg | Freq: Once | INTRAMUSCULAR | Status: AC
  Administered 2014-02-28: 4 mg via INTRAVENOUS
  Filled 2014-02-28: qty 2

## 2014-02-28 MED ORDER — PROMETHAZINE HCL 25 MG PO TABS: 25.0000 mg | ORAL_TABLET | Freq: Four times a day (QID) | ORAL | Status: AC | PRN

## 2014-02-28 NOTE — ED Notes (Signed)
Pt presents d/t left sided flank pain.  [facility] for [facility].  Pt has not followed up as he is here on work and returning home later this week.  +nausea/vomiting.

## 2014-02-28 NOTE — ED Provider Notes (Signed)
CSN: [id number]     Arrival date & time 02/28/14  0140 History   First MD Initiated Contact with Patient 02/28/14 0214     Chief Complaint  Patient presents with  . Flank Pain    left side     (Consider location/radiation/quality/duration/timing/severity/associated sxs/prior Treatment) HPI Comments: Patient with known kidney stone seen in this emergency department on November 4 was discharged home with pain medication and antiemetics has been doing well until this evening when he was acutely awakened from sleep with left flank pain radiating into the left testicle, nausea not relieved by any of his home therapies.  Patient is a 28 y.o. male presenting with flank pain. The history is provided by the patient.  Flank Pain This is a recurrent problem. The current episode started in the past 7 days. The problem occurs constantly. The problem has been gradually worsening. Associated symptoms include abdominal pain and nausea. Pertinent negatives include no fever, rash or vomiting. Nothing aggravates the symptoms. He has tried nothing for the symptoms. The treatment provided no relief.    Past Medical History  Diagnosis Date  . Kidney stones    Past Surgical History  Procedure Laterality Date  . Tongue surgery     History reviewed. No pertinent family history. History  Substance Use Topics  . Smoking status: Current Every Day Smoker -- 0.50 packs/day    Types: Cigarettes  . Smokeless tobacco: Not on file  . Alcohol Use: No    Review of Systems  Constitutional: Negative for fever.  HENT: Negative for rhinorrhea.   Respiratory: Negative for shortness of breath.   Gastrointestinal: Positive for nausea and abdominal pain. Negative for vomiting.  Genitourinary: Positive for flank pain and testicular pain. Negative for dysuria.  Skin: Negative for rash and wound.  All other systems reviewed and are negative.     Allergies  Review of patient's allergies indicates no known  allergies.  Home Medications   Prior to Admission medications   Medication Sig Start Date End Date Taking? Authorizing Provider  oxyCODONE-acetaminophen (PERCOCET/ROXICET) 5-325 MG per tablet Take 1 tablet by mouth every 6 (six) hours as needed. 02/28/14   Arman FilterGail K Augusta Mirkin, NP  promethazine (PHENERGAN) 25 MG tablet Take 1 tablet (25 mg total) by mouth every 6 (six) hours as needed for nausea or vomiting. 02/28/14   Arman FilterGail K Jakelyn Squyres, NP   BP 106/67 mmHg  Pulse 67  Temp(Src) 97.7 F (36.5 C) (Oral)  Resp 16  SpO2 97% Physical Exam  Constitutional: He appears well-developed and well-nourished.  HENT:  Head: Normocephalic.  Eyes: Pupils are equal, round, and reactive to light.  Neck: Normal range of motion.  Cardiovascular: Normal rate and regular rhythm.   Pulmonary/Chest: Effort normal.  Abdominal: Soft.  Genitourinary: Penis normal.  Musculoskeletal: Normal range of motion.  Neurological: He is alert.  Skin: Skin is warm. No rash noted.  Nursing note and vitals reviewed.   ED Course  Procedures (including critical care time) Labs Review Labs Reviewed - No data to display  Imaging Review No results found.   EKG Interpretation None     Will attempt to control pain with IV Toradol, Zofran and Dialudid Patient had gotten a moderate amount of relief from his discomfort edema and a second dose of Dilaudid will be discharged him with prescription for Percocet and Phenergan.  He's been cautious not on rate.  Any large machinery.Marland Kitchen.  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

## 2014-02-28 NOTE — ED Notes (Signed)
Patient began squirming around on the stretcher and stated, "it's moving."  He reports that nausea comes in waves when the stone seems to be moving.  Friend at bedside.

## 2014-02-28 NOTE — Discharge Instructions (Signed)
Kidney Stones Kidney stones (urolithiasis) are solid masses that form inside your kidneys. The intense pain is caused by the stone moving through the kidney, ureter, bladder, and urethra (urinary tract). When the stone moves, the ureter starts to spasm around the stone. The stone is usually passed in your pee (urine).  HOME CARE  Drink enough fluids to keep your pee clear or pale yellow. This helps to get the stone out.  Strain all pee through the provided strainer. Do not pee without peeing through the strainer, not even once. If you pee the stone out, catch it in the strainer. The stone may be as small as a grain of salt. Take this to your doctor. This will help your doctor figure out what you can do to try to prevent more kidney stones.  Only take medicine as told by your doctor.  Follow up with your doctor as told.  Get follow-up X-rays as told by your doctor. GET HELP IF: You have pain that gets worse even if you have been taking pain medicine. GET HELP RIGHT AWAY IF:   Your pain does not get better with medicine.  You have a fever or shaking chills.  Your pain increases and gets worse over 18 hours.  You have new belly (abdominal) pain.  You feel faint or pass out.  You are unable to pee. MAKE SURE YOU:   Understand these instructions.  Will watch your condition.  Will get help right away if you are not doing well or get worse. Document Released: 09/26/2007 Document Revised: 12/10/2012 Document Reviewed: 09/10/2012 [facility] Patient Information 2015 [employer] [place]. This information is not intended to replace advice given to you by your health care provider. Make sure you discuss any questions you have with your health care provider. Make sure to make an appointment with your primary care physician or your urologist when you arrive home You have  been given additional pain control as well as antiemetics, this being a narcotic.  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

## 2015-04-23 ENCOUNTER — Emergency Department (HOSPITAL_COMMUNITY)
Admission: EM | Admit: 2015-04-23 | Discharge: 2015-04-23 | Discharge status: Against Medical Advice | Payer: Self-pay | Attending: Emergency Medicine | Admitting: Emergency Medicine

## 2015-04-23 ENCOUNTER — Emergency Department (HOSPITAL_COMMUNITY): Payer: Self-pay

## 2015-04-23 ENCOUNTER — Encounter (HOSPITAL_COMMUNITY): Payer: Self-pay | Admitting: Emergency Medicine

## 2015-04-23 DIAGNOSIS — Z765 Malingerer [conscious simulation]: Secondary | ICD-10-CM

## 2015-04-23 DIAGNOSIS — R109 Unspecified abdominal pain: Secondary | ICD-10-CM | POA: Insufficient documentation

## 2015-04-23 DIAGNOSIS — Z7289 Other problems related to lifestyle: Secondary | ICD-10-CM | POA: Insufficient documentation

## 2015-04-23 DIAGNOSIS — R112 Nausea with vomiting, unspecified: Secondary | ICD-10-CM | POA: Insufficient documentation

## 2015-04-23 DIAGNOSIS — Z87442 Personal history of urinary calculi: Secondary | ICD-10-CM | POA: Insufficient documentation

## 2015-04-23 DIAGNOSIS — F17210 Nicotine dependence, cigarettes, uncomplicated: Secondary | ICD-10-CM | POA: Insufficient documentation

## 2015-04-23 MED ORDER — ONDANSETRON HCL 4 MG/2ML IJ SOLN
4.0000 mg | Freq: Once | INTRAMUSCULAR | Status: AC
  Administered 2015-04-23: 4 mg via INTRAVENOUS

## 2015-04-23 MED ORDER — ONDANSETRON HCL 4 MG/2ML IJ SOLN: 4.0000 mg | Freq: Once | INTRAMUSCULAR | Status: DC

## 2015-04-23 MED ORDER — ONDANSETRON HCL 4 MG/2ML IJ SOLN
INTRAMUSCULAR | Status: AC
  Filled 2015-04-23: qty 2

## 2015-04-23 NOTE — ED Notes (Signed)
Pt states [facility] was [facility] and had a CT scan. Pain returned to left flank this morning at ~0800. Pt reports vomiting x 5 since waking.

## 2015-04-23 NOTE — ED Notes (Signed)
Pt stated, "I need something for nausea right now." Explained that I would ask for an order. No active heaving or vomiting. Obtained order and retrieved medication. Pt called out while medication was being prepared. Went to room and asked if he needed something, Pt stated,"Yea, I just want to leave, this is taking too long." I showed him that I had the medication in my hand and asked if he wanted to stay. He then said, "yes, But it was taking so long, I was just going to leave and go to the store." Medication given. Pt informed of plan in place including xray and need of a urine specimen.

## 2015-04-23 NOTE — ED Notes (Addendum)
Xray tech went in to get pt. Per tech, "Pt says he is done and wants to leave if he isn't going to get pain medicine." Went in to speak with pt. Pt has only asked for nausea medication previously. Pt sitting in bed with no grimacing, moaning or guarding. Explained to pt that as soon as we received specimen and xray we would know better of what was going on. EDP ware. Pt stated he was ready to leave. IV removed. Pt ambulated out without signing AMA

## 2015-04-23 NOTE — ED Provider Notes (Signed)
CSN: [id number]     Arrival date & time 04/23/15  1002 History  By signing my name below, I, Placido SouLogan Joldersma, attest that this documentation has been prepared under the direction and in the presence of Benjiman CoreNathan Jidenna Figgs, MD. Electronically Signed: Placido SouLogan Joldersma, ED Scribe. 04/23/2015. 10:42 AM.   Chief Complaint  Patient presents with  . Flank Pain   The history is provided by the patient. No language interpreter was used.    HPI Comments: Judithann Gravesnthony Blowe is a 29 y.o. male who presents to the Emergency Department complaining of moderate left flank pain with onset 3 days ago and worsening pain beginning this morning. [facility] and received a CT scan showing a kidney stone "in the left side on the way down" further noting his symptoms having worsened this morning with associated nausea and x5 vomiting today. He denies taking anything for pain management. Pt notes having an appointment with his specialist but cannot be seen for 4 days. He endorses a hx of kidney stones and confirms this pain feels similar to past occurences. He denies any other associated symptoms at this time.    Past Medical History  Diagnosis Date  . Kidney stones    Past Surgical History  Procedure Laterality Date  . Tongue surgery     History reviewed. No pertinent family history. Social History  Substance Use Topics  . Smoking status: Current Every Day Smoker -- 0.50 packs/day    Types: Cigarettes  . Smokeless tobacco: None  . Alcohol Use: No    Review of Systems  Gastrointestinal: Positive for nausea and vomiting.  Genitourinary: Positive for flank pain.  All other systems reviewed and are negative.   Allergies  Review of patient's allergies indicates no known allergies.  Home Medications   Prior to Admission medications   Medication Sig Start Date End Date Taking? Authorizing Provider  oxyCODONE-acetaminophen (PERCOCET/ROXICET) 5-325 MG per tablet Take 1 tablet by  mouth every 6 (six) hours as needed. 02/28/14   Earley FavorGail Schulz, NP  promethazine (PHENERGAN) 25 MG tablet Take 1 tablet (25 mg total) by mouth every 6 (six) hours as needed for nausea or vomiting. 02/28/14   Earley FavorGail Schulz, NP   BP 131/78 mmHg  Pulse 109  Resp 18  Ht 5\' 10"  (1.778 m)  Wt 160 lb (72.576 kg)  BMI 22.96 kg/m2  SpO2 100% Physical Exam  Constitutional: He is oriented to person, place, and time. He appears well-developed and well-nourished.  HENT:  Head: Normocephalic and atraumatic.  Mouth/Throat: No oropharyngeal exudate.  Neck: Normal range of motion. No tracheal deviation present.  Cardiovascular: Normal rate.   Pulmonary/Chest: Effort normal and breath sounds normal. No respiratory distress. He has no wheezes. He has no rales.  Abdominal: Soft. There is no tenderness.  abd non tender; no CVA tenderness  Musculoskeletal: Normal range of motion.  Neurological: He is alert and oriented to person, place, and time.  Skin: Skin is warm and dry. He is not diaphoretic.  Psychiatric: He has a normal mood and affect. His behavior is normal.  Nursing note and vitals reviewed.   ED Course  Procedures  DIAGNOSTIC STUDIES: Oxygen Saturation is 100% on RA, normal by my interpretation.    COORDINATION OF CARE: 10:40 AM Pt presents today due to flank pain. Discussed next steps with pt and he agreed to the plan.   Labs Review Labs Reviewed - No data to display  Imaging Review No results found. I  have personally reviewed and evaluated these images and lab results as part of my medical decision-making.   EKG Interpretation None      MDM   Final diagnoses:  None   patient presented with flank pain. Previous history of kidney stones. Reportedly had CT scan at [facility] days ago. [place] drug database reviewed and had prescription for Vicodin filled 11 days ago. Patient denied this and states it must have been his father. His father it was filled dominant [place]. He states he does work down in [place].. Patient was not given narcotics initially and then left when he was not getting any. Records were attempted to get for [facility] however they were never faxed. Since patient left once he was told he would not immediately get narcotics it is suspicious for drug-seeking behavior, along with the denying other narcotic prescriptions. Review of previous records shows that he had previously been dishonest with an outside hospital about narcotic use.  I personally performed the services described in this documentation, which was scribed in my presence. The recorded information has been reviewed and is accurate.      Benjiman Core, MD 04/23/15 [phone]

## 2015-04-23 NOTE — ED Notes (Signed)
Pt states that he has been having left flank pain with n/v since last night.  Feels like kidney stones he had in the past.

## 2015-05-18 IMAGING — CR DG ABDOMEN ACUTE W/ 1V CHEST
3 series · 3 of 3 positions shown · non-contrast
Comparison: None.

CLINICAL DATA: Flank pain and nausea

EXAM:
ACUTE ABDOMEN SERIES (ABDOMEN 2 VIEW & CHEST 1 VIEW)

[view not recorded (1 of 3)]
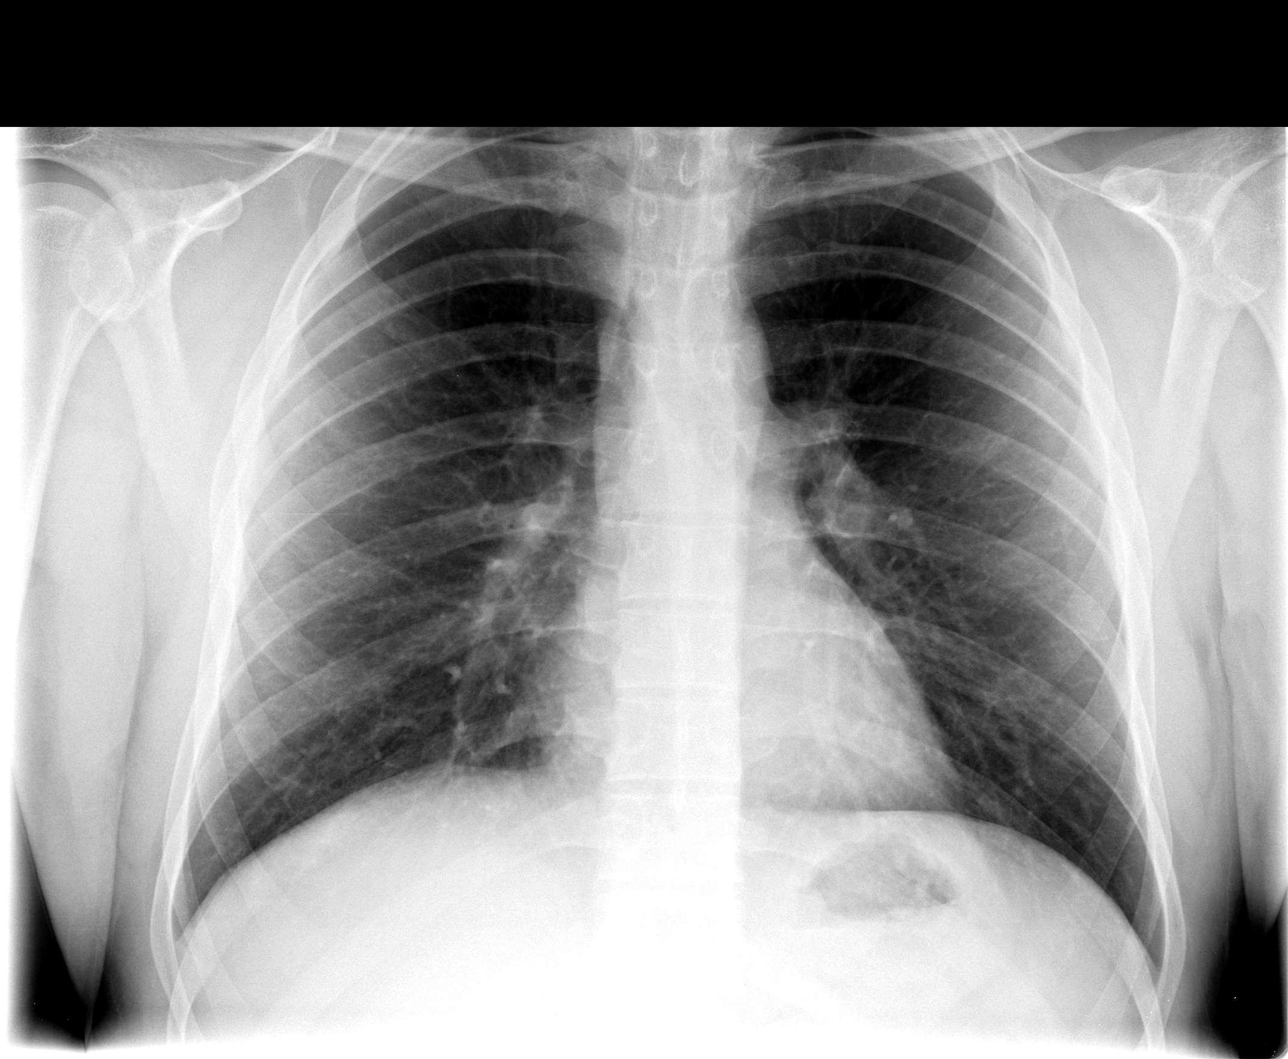

[view not recorded (2 of 3)]
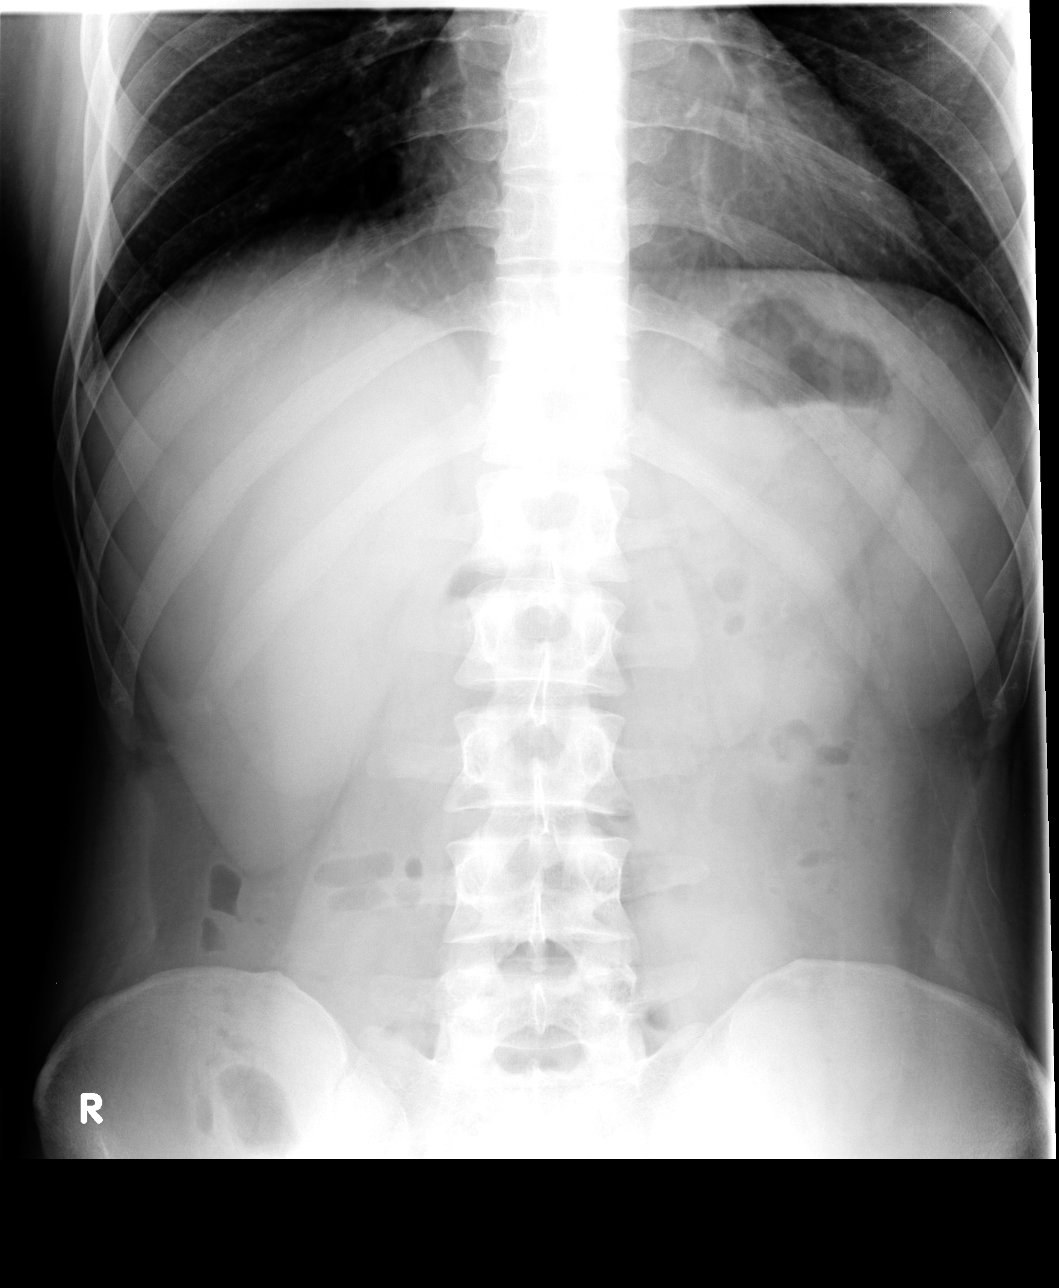

[view not recorded (3 of 3)]
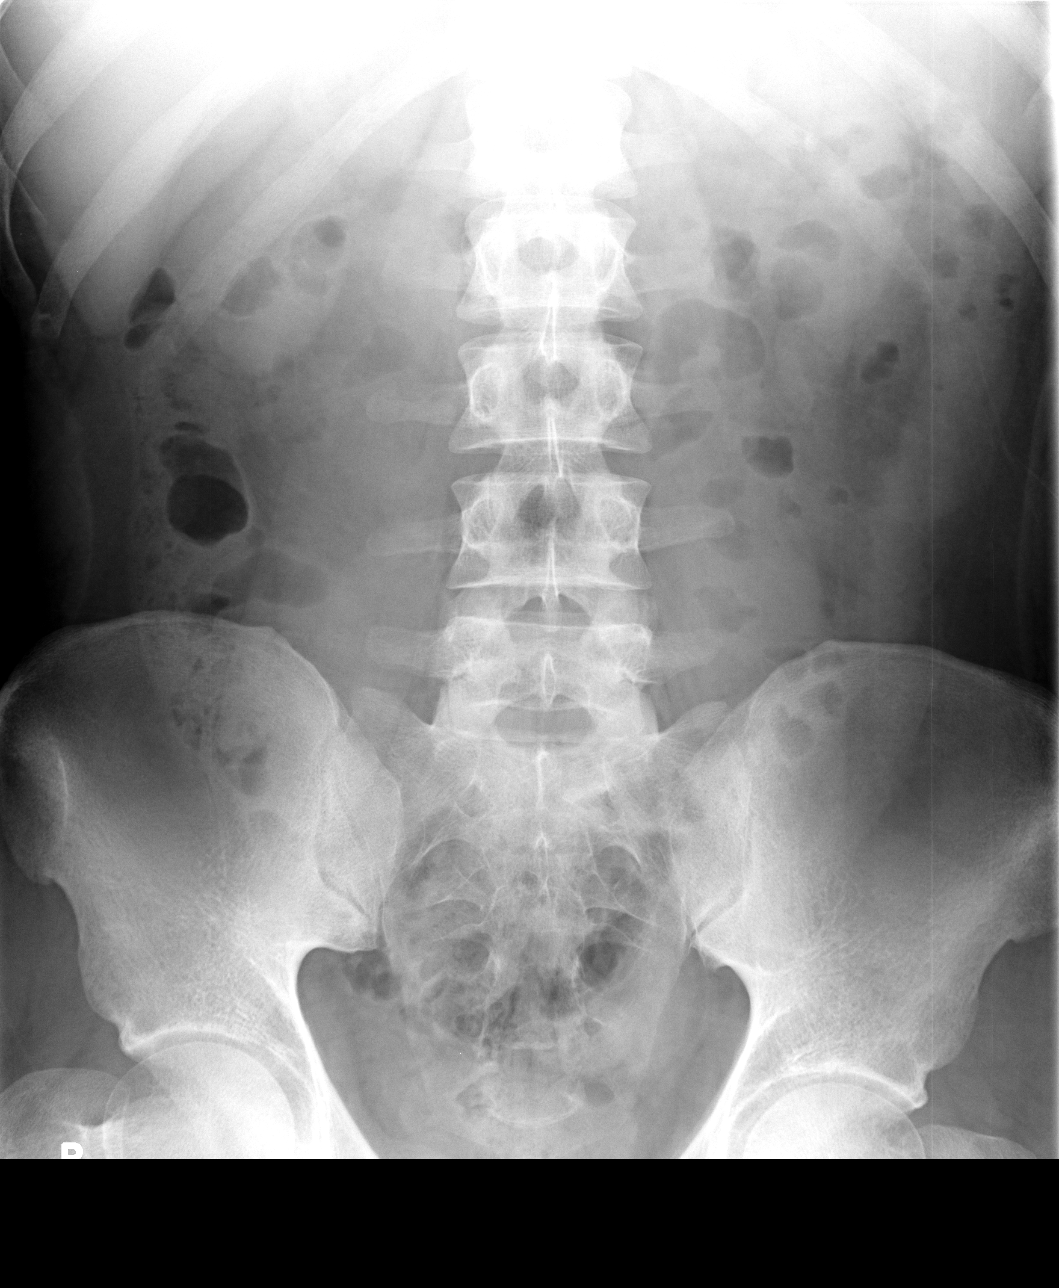

[3 of 3 positions shown; findings below may reference images not displayed]

FINDINGS: Cardiac shadow is within normal limits. The lungs are clear
bilaterally.

Calcification is noted of the left mid kidney consistent with a
nonobstructing stone. No definitive ureteral calculi are seen. No
other focal abnormality is noted. No bony abnormality is seen.
IMPRESSION: Calcification of the left kidney consistent with a nonobstructing
renal stone. No other focal abnormality is noted.

## 2015-10-07 IMAGING — US US RENAL
1 series · 14 of 25 positions shown · non-contrast
Comparison: None.

CLINICAL DATA: Left flank pain.  History kidney stones.

EXAM:
RENAL/URINARY TRACT ULTRASOUND COMPLETE

[Series 1: us renal · 0.24mm/px · 14 of 33 slices shown]
[im 1/33]
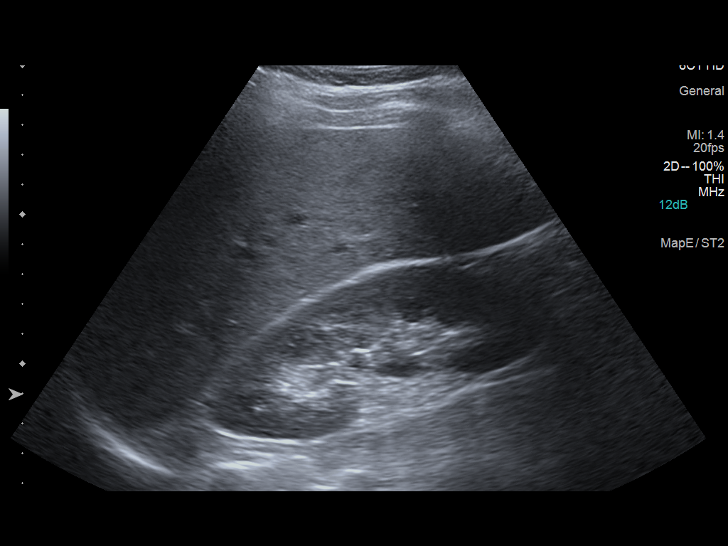
[im 3/33]
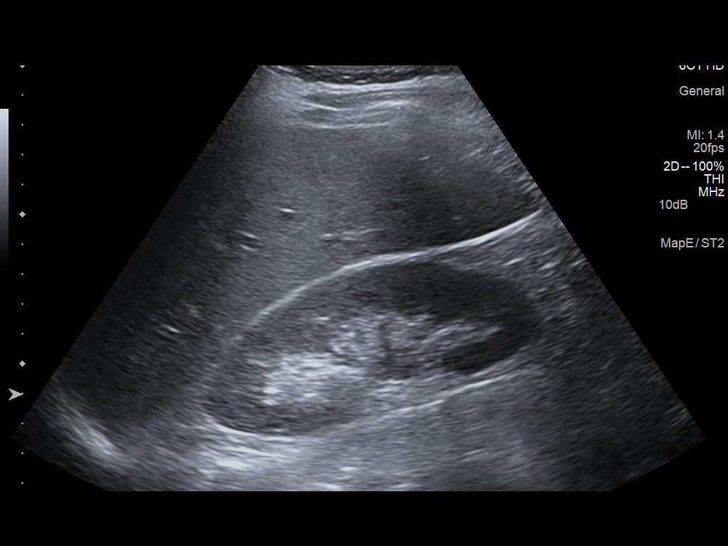
[im 6/33]
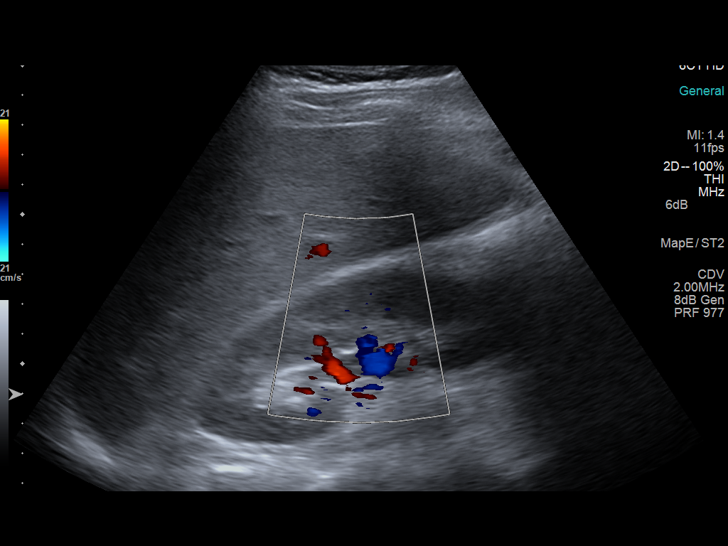
[im 9/33]
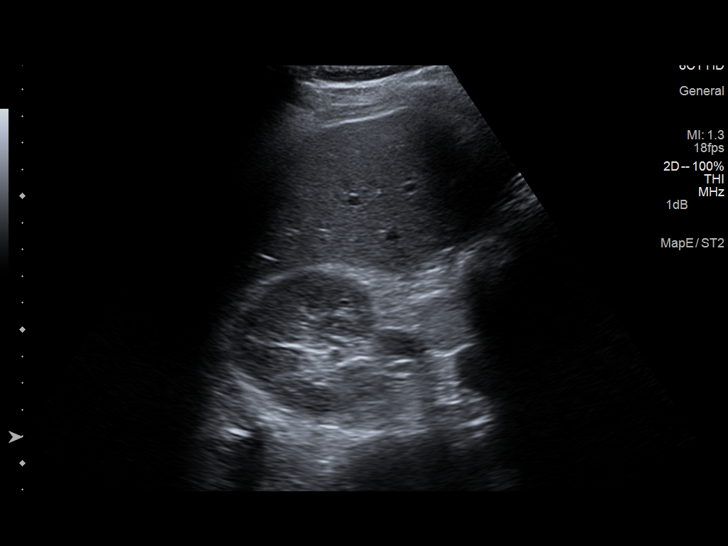
[im 11/33]
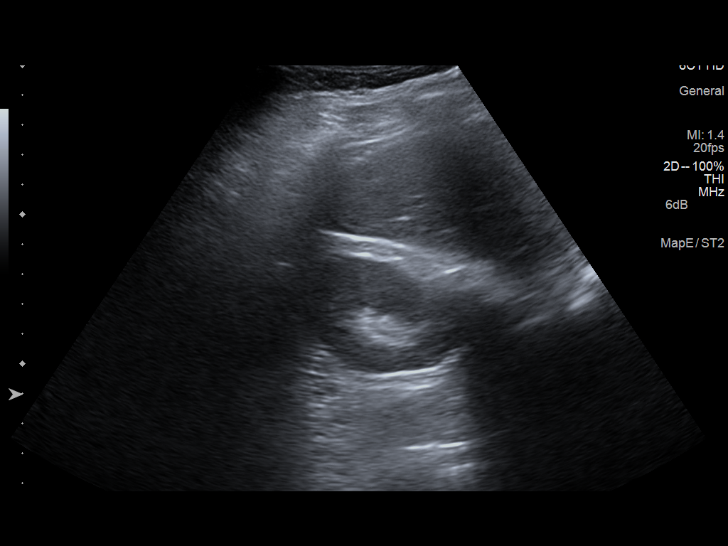
[im 13/33]
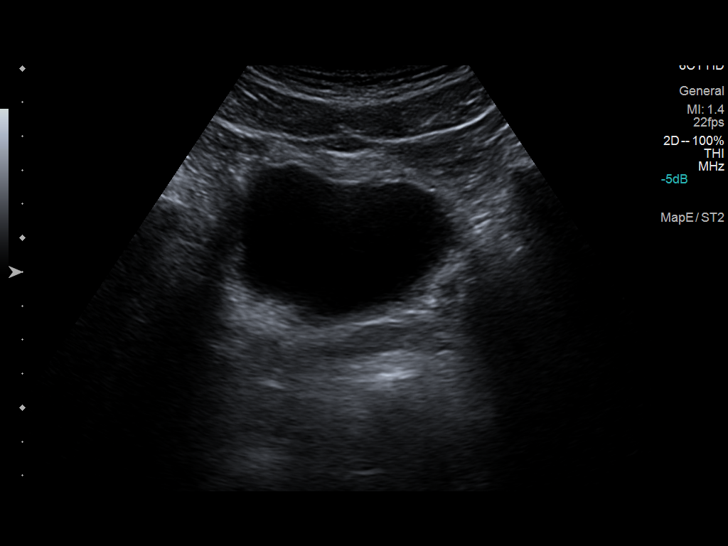
[im 15/33]
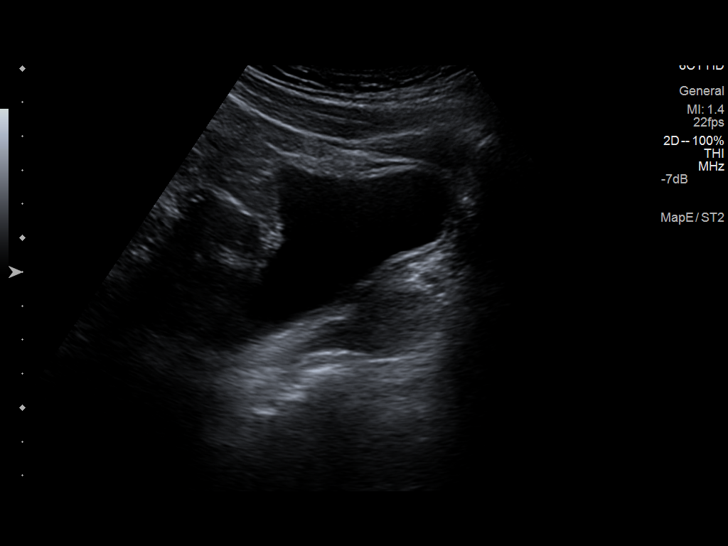
[im 18/33]
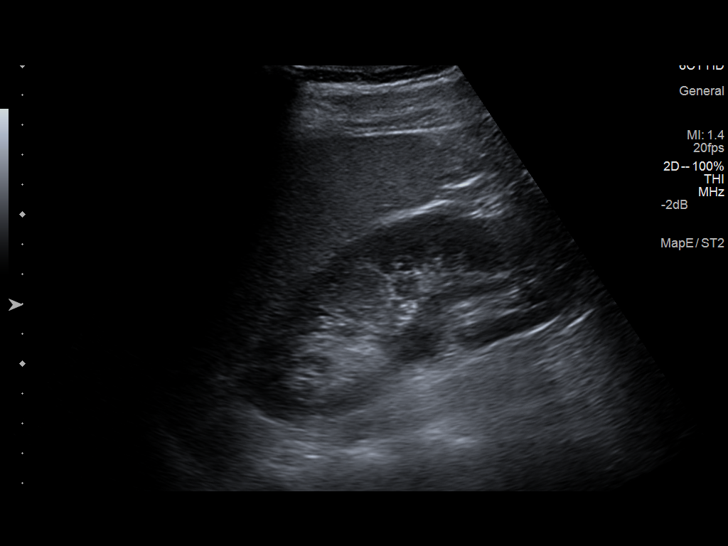
[im 21/33]
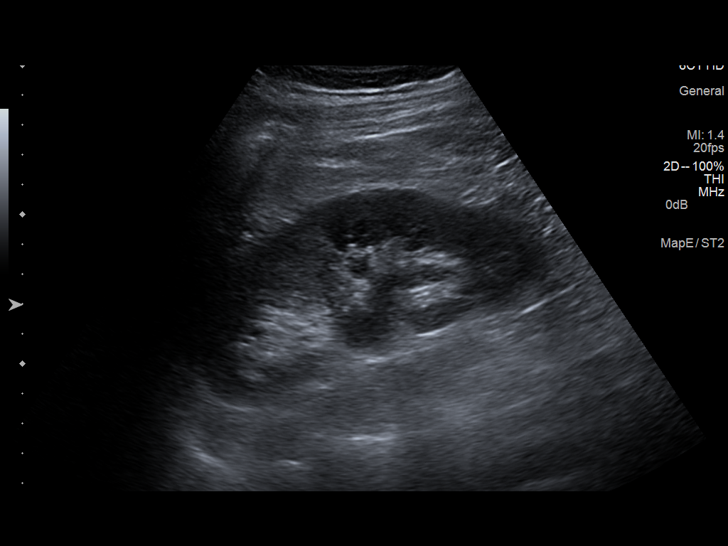
[im 22/33]
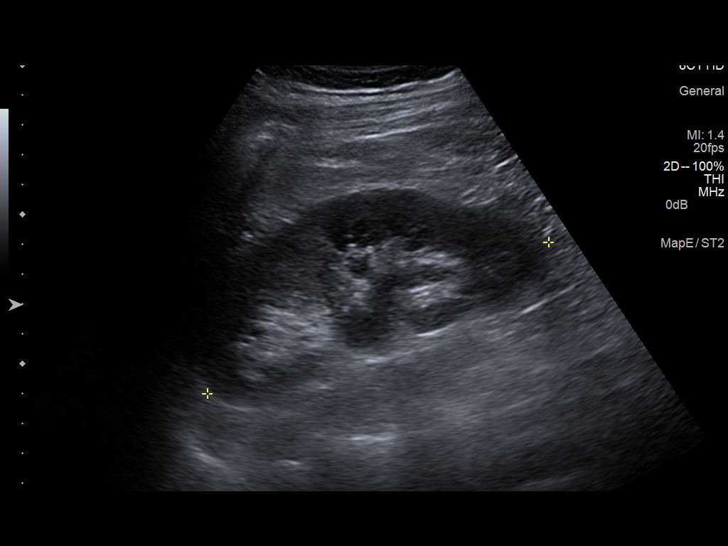
[im 25/33]
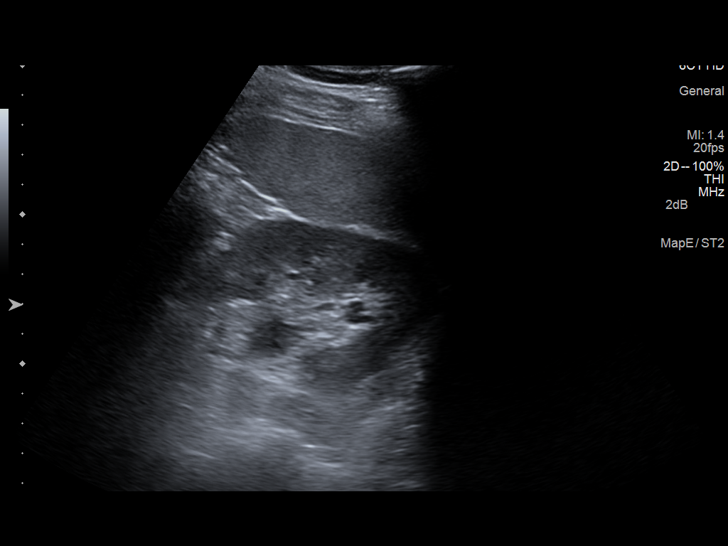
[im 27/33]
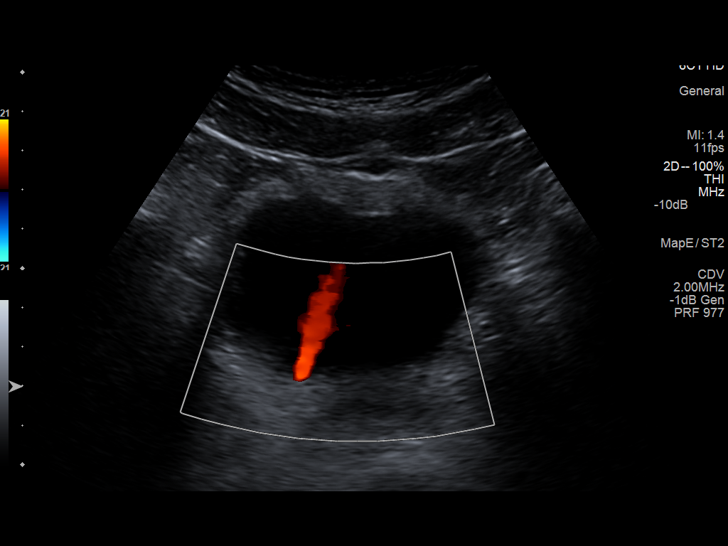
[im 30/33]
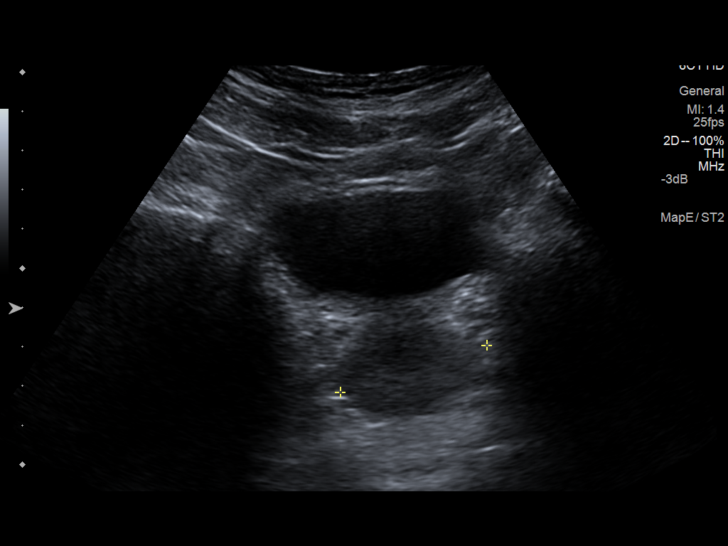
[im 33/33]
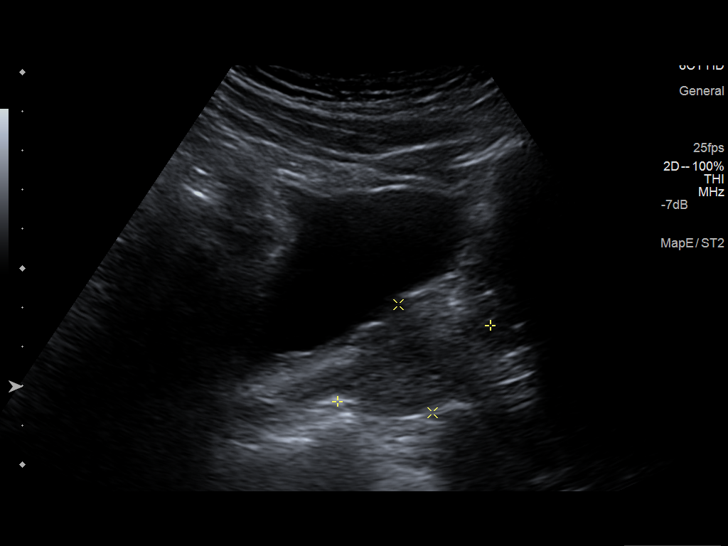

[14 of 25 positions shown; findings below may reference images not displayed]

FINDINGS: Right Kidney:

Length: 11.9 cm. Echogenicity within normal limits. No mass or
hydronephrosis visualized.

Left Kidney:

Length: 12.5 cm. Mild fullness of the left renal collecting system,
likely Early left hydronephrosis. No visible stones within the left
kidney or proximal ureter. No focal lesion. Normal echotexture.

Bladder:

Appears normal for degree of bladder distention.
IMPRESSION: Mild fullness of the left renal collecting system, likely early
hydronephrosis.

## 2016-07-04 ENCOUNTER — Emergency Department (HOSPITAL_COMMUNITY)
Admission: EM | Admit: 2016-07-04 | Discharge: 2016-07-04 | Disposition: A | Discharge status: Home / Self Care | Payer: Self-pay | Attending: Emergency Medicine | Admitting: Emergency Medicine

## 2016-07-04 ENCOUNTER — Encounter (HOSPITAL_COMMUNITY): Payer: Self-pay | Admitting: Emergency Medicine

## 2016-07-04 DIAGNOSIS — B86 Scabies: Secondary | ICD-10-CM | POA: Insufficient documentation

## 2016-07-04 DIAGNOSIS — F17210 Nicotine dependence, cigarettes, uncomplicated: Secondary | ICD-10-CM | POA: Insufficient documentation

## 2016-07-04 MED ORDER — CEPHALEXIN 500 MG PO CAPS: 500.0000 mg | ORAL_CAPSULE | Freq: Four times a day (QID) | ORAL | 0 refills | Status: AC

## 2016-07-04 MED ORDER — HYDROXYZINE HCL 10 MG PO TABS
10.0000 mg | ORAL_TABLET | Freq: Once | ORAL | Status: DC
  Filled 2016-07-04: qty 1

## 2016-07-04 MED ORDER — PERMETHRIN 5 % EX CREA: TOPICAL_CREAM | CUTANEOUS | 1 refills | Status: AC

## 2016-07-04 MED ORDER — HYDROXYZINE HCL 25 MG PO TABS: 25.0000 mg | ORAL_TABLET | Freq: Four times a day (QID) | ORAL | 0 refills | Status: AC

## 2016-07-04 MED ORDER — HYDROXYZINE HCL 25 MG PO TABS
25.0000 mg | ORAL_TABLET | Freq: Once | ORAL | Status: AC
  Administered 2016-07-04: 25 mg via ORAL
  Filled 2016-07-04: qty 1

## 2016-07-04 NOTE — ED Provider Notes (Signed)
[facility]-EMERGENCY DEPT Provider Note   CSN: [id number] Arrival date & time: 07/04/16  1256  By signing my name below, I, Caleb Miller, attest that this documentation has been prepared under the direction and in the presence of Sheridan Gettel P. Yoseph Haile, PA-C. Electronically Signed: Marnette Burgess Miller, Scribe. 07/04/2016. 1:52 PM.   History   Chief Complaint Chief Complaint  Patient presents with  . Insect Bite   The history is provided by the patient and medical records. No language interpreter was used.    HPI Comments:  Caleb Miller is a 31 y.o. male who presents to the Emergency Department complaining of multiple, pruritic sites diffusely spread across his body onset three days ago. He states recently staying in a hotel when the bites started to appear on his fingers and hands now spreading to his bilateral legs, arms, chest, ears, and back. He is not currently in pain, the sites are presenting with associated redness. He additionally reports he has had scabies in the past which presented similar to his skin today but not as severe as today. He did not try anything at home for relief of his symptoms. Pt denies fever or any other complaints at this time.   Past Medical History:  Diagnosis Date  . Kidney stones     There are no active problems to display for this patient.   Past Surgical History:  Procedure Laterality Date  . TONGUE SURGERY      Home Medications    Prior to Admission medications   Medication Sig Start Date End Date Taking? Authorizing Provider  oxyCODONE-acetaminophen (PERCOCET/ROXICET) 5-325 MG per tablet Take 1 tablet by mouth every 6 (six) hours as needed. 02/28/14   Earley Favor, NP  promethazine (PHENERGAN) 25 MG tablet Take 1 tablet (25 mg total) by mouth every 6 (six) hours as needed for nausea or vomiting. 02/28/14   Earley Favor, NP    Family History No family history on file.  Social History Social History  Substance Use Topics  . Smoking status: Current  Every Day Smoker    Packs/day: 0.50    Types: Cigarettes  . Smokeless tobacco: Not on file  . Alcohol use No     Allergies   Patient has no known allergies.   Review of Systems Review of Systems  Constitutional: Negative for fever.  Musculoskeletal: Negative for myalgias.  Skin: Positive for color change and wound.     Physical Exam Updated Vital Signs BP 133/70 (BP Location: Right Arm)   Pulse 70   Temp 98.8 F (37.1 C) (Oral)   Resp 16   SpO2 100%   Physical Exam  Constitutional: He appears well-developed and well-nourished. No distress.  HENT:  Head: Normocephalic and atraumatic.  Neck: Neck supple.  Cardiovascular: Normal rate, regular rhythm and normal heart sounds.   No murmur heard. Pulmonary/Chest: Effort normal and breath sounds normal. No respiratory distress. He has no wheezes.  Musculoskeletal: Normal range of motion.  Neurological: He is alert.  Skin: Skin is warm and dry.  Scattered burrows and sores of arms and legs, multiple with surrounding erythema.   Nursing note and vitals reviewed.    ED Treatments / Results  DIAGNOSTIC STUDIES:  Oxygen Saturation is 100% on RA, normal by my interpretation.    COORDINATION OF CARE:  2:25 PM Discussed treatment plan with pt at bedside including Abx, itching cream, and permethrin cream and pt agreed to plan.  Labs (all labs ordered are listed, but only abnormal results are  displayed) Labs Reviewed - No data to display  EKG  EKG Interpretation None       Radiology No results found.  Procedures Procedures (including critical care time)  Medications Ordered in ED Medications - No data to display   Initial Impression / Assessment and Plan / ED Course  I have reviewed the triage vital signs and the nursing notes.  Pertinent labs & imaging results that were available during my care of the patient were reviewed by me and considered in my medical decision making (see chart for details).       Caleb Miller is a [age] y.o. male who presents to ED for rash c/w scabies, however he does have a few that appear to be superinfected due to persistent scratching. Will treat for scabies with permethrin, but also give rx for keflex. Atarax prn itching. Home care instructions for scabies including laundering linens, etc. Discussed. All questions answered.     Final Clinical Impressions(s) / ED Diagnoses   Final diagnoses:  None    New Prescriptions New Prescriptions   No medications on file   I personally performed the services described in this documentation, which was scribed in my presence. The recorded information has been reviewed and is accurate.     [facility] Mem HospJaime Pilcher Freddy Kinne, PA-C 07/04/16 1653    Canary Brimhristopher J Tegeler, MD 07/04/16 [phone]

## 2016-07-04 NOTE — ED Triage Notes (Signed)
Pt reports staying at a hotel a couple of days ago and since then has had bites around in finger nails and hands pt also had sore on his calf's. Pt states his bother has scabies and it looked like this.

## 2016-07-04 NOTE — Discharge Instructions (Signed)
It was my pleasure taking care of you today!  Atarax as needed for itching. Apply Permethrin as directed. You may also use camomile lotion if needed - try not to scratch! Please follow-up with your primary doctor in 3-5 days for discussion of your diagnoses and further evaluation after today's visit; Please return to the ER for new or worsening symptoms, signs of infection or any additional concerns.
